# Patient Record
Sex: Male | Born: 1970
Health system: Southern US, Community
[De-identification: ages and names within clinical notes are randomized; demographics above are authoritative.]

## PROBLEM LIST (undated history)

## (undated) DIAGNOSIS — E785 Hyperlipidemia, unspecified: Secondary | ICD-10-CM

## (undated) DIAGNOSIS — N200 Calculus of kidney: Secondary | ICD-10-CM

## (undated) DIAGNOSIS — Z6841 Body Mass Index (BMI) 40.0 and over, adult: Secondary | ICD-10-CM

## (undated) DIAGNOSIS — J301 Allergic rhinitis due to pollen: Secondary | ICD-10-CM

## (undated) DIAGNOSIS — K219 Gastro-esophageal reflux disease without esophagitis: Secondary | ICD-10-CM

## (undated) DIAGNOSIS — E039 Hypothyroidism, unspecified: Secondary | ICD-10-CM

## (undated) HISTORY — DX: Allergic rhinitis due to pollen: J30.1

## (undated) HISTORY — DX: Gastro-esophageal reflux disease without esophagitis: K21.9

## (undated) HISTORY — DX: Calculus of kidney: N20.0

## (undated) HISTORY — DX: Hyperlipidemia, unspecified: E78.5

## (undated) HISTORY — PX: GANGLION CYST EXCISION: SHX1691

## (undated) HISTORY — DX: Body Mass Index (BMI) 40.0 and over, adult: Z684

## (undated) HISTORY — DX: Hypothyroidism, unspecified: E03.9

## (undated) HISTORY — DX: Morbid (severe) obesity due to excess calories: E66.01

## (undated) HISTORY — PX: OTHER SURGICAL HISTORY: SHX169

---

## 1997-06-25 ENCOUNTER — Ambulatory Visit (HOSPITAL_BASED_OUTPATIENT_CLINIC_OR_DEPARTMENT_OTHER): Admission: RE | Admit: 1997-06-25 | Discharge: 1997-06-25 | Payer: Self-pay | Admitting: *Deleted

## 1997-12-29 ENCOUNTER — Other Ambulatory Visit: Admission: RE | Admit: 1997-12-29 | Discharge: 1997-12-29 | Payer: Self-pay | Admitting: Obstetrics & Gynecology

## 2003-07-28 ENCOUNTER — Encounter (INDEPENDENT_AMBULATORY_CARE_PROVIDER_SITE_OTHER): Payer: Self-pay | Admitting: Internal Medicine

## 2004-01-07 ENCOUNTER — Ambulatory Visit: Payer: Self-pay | Admitting: Family Medicine

## 2005-06-14 ENCOUNTER — Ambulatory Visit: Payer: Self-pay | Admitting: Family Medicine

## 2005-11-08 ENCOUNTER — Emergency Department: Payer: Self-pay

## 2005-11-12 ENCOUNTER — Ambulatory Visit: Payer: Self-pay | Admitting: Family Medicine

## 2005-11-15 ENCOUNTER — Ambulatory Visit: Payer: Self-pay | Admitting: Urology

## 2005-11-27 ENCOUNTER — Ambulatory Visit: Payer: Self-pay | Admitting: Family Medicine

## 2006-02-18 ENCOUNTER — Emergency Department: Payer: Self-pay | Admitting: Emergency Medicine

## 2006-03-26 ENCOUNTER — Ambulatory Visit: Payer: Self-pay | Admitting: Family Medicine

## 2006-03-26 LAB — CONVERTED CEMR LAB
ALT: 49 units/L — ABNORMAL HIGH (ref 0–40)
AST: 29 units/L (ref 0–37)
Albumin: 4 g/dL (ref 3.5–5.2)
Alkaline Phosphatase: 87 units/L (ref 39–117)
BUN: 11 mg/dL (ref 6–23)
CO2: 29 meq/L (ref 19–32)
Calcium: 9.9 mg/dL (ref 8.4–10.5)
Chloride: 106 meq/L (ref 96–112)
Cholesterol: 219 mg/dL (ref 0–200)
Creatinine, Ser: 1.2 mg/dL (ref 0.4–1.5)
Direct LDL: 143.1 mg/dL
Free T4: 0.9 ng/dL (ref 0.6–1.6)
GFR calc Af Amer: 88 mL/min
GFR calc non Af Amer: 73 mL/min
Glucose, Bld: 103 mg/dL — ABNORMAL HIGH (ref 70–99)
HDL: 35.8 mg/dL — ABNORMAL LOW (ref >39.0)
Potassium: 4.6 meq/L (ref 3.5–5.1)
Sodium: 142 meq/L (ref 135–145)
TSH: 2.09 microintl units/mL (ref 0.35–5.50)
Total Bilirubin: 1.1 mg/dL (ref 0.3–1.2)
Total CHOL/HDL Ratio: 6.1
Total Protein: 7.4 g/dL (ref 6.0–8.3)
Triglycerides: 202 mg/dL (ref 0–149)
VLDL: 40 mg/dL (ref 0–40)

## 2006-03-27 ENCOUNTER — Ambulatory Visit: Payer: Self-pay | Admitting: Family Medicine

## 2006-05-06 ENCOUNTER — Ambulatory Visit: Payer: Self-pay | Admitting: Family Medicine

## 2006-09-23 ENCOUNTER — Ambulatory Visit: Payer: Self-pay | Admitting: Family Medicine

## 2006-09-24 LAB — CONVERTED CEMR LAB: TSH: 1.65 microintl units/mL (ref 0.35–5.50)

## 2006-10-23 ENCOUNTER — Emergency Department: Payer: Self-pay | Admitting: Emergency Medicine

## 2006-10-27 ENCOUNTER — Observation Stay: Payer: Self-pay | Admitting: Urology

## 2006-12-04 ENCOUNTER — Telehealth (INDEPENDENT_AMBULATORY_CARE_PROVIDER_SITE_OTHER): Payer: Self-pay | Admitting: *Deleted

## 2007-04-09 ENCOUNTER — Ambulatory Visit: Payer: Self-pay | Admitting: Urology

## 2007-06-19 ENCOUNTER — Ambulatory Visit: Payer: Self-pay | Admitting: Family Medicine

## 2007-06-19 DIAGNOSIS — R5381 Other malaise: Secondary | ICD-10-CM | POA: Insufficient documentation

## 2007-06-19 DIAGNOSIS — R5383 Other fatigue: Secondary | ICD-10-CM

## 2007-06-20 LAB — CONVERTED CEMR LAB
BUN: 11 mg/dL (ref 6–23)
Basophils Absolute: 0 10*3/uL (ref 0.0–0.1)
Basophils Relative: 0.4 % (ref 0.0–1.0)
CO2: 30 meq/L (ref 19–32)
Calcium: 9.5 mg/dL (ref 8.4–10.5)
Chloride: 107 meq/L (ref 96–112)
Creatinine, Ser: 1 mg/dL (ref 0.4–1.5)
Eosinophils Absolute: 0.1 10*3/uL (ref 0.0–0.7)
Eosinophils Relative: 0.7 % (ref 0.0–5.0)
GFR calc Af Amer: 108 mL/min
GFR calc non Af Amer: 89 mL/min
Glucose, Bld: 90 mg/dL (ref 70–99)
HCT: 48.6 % (ref 39.0–52.0)
Hemoglobin: 16.5 g/dL (ref 13.0–17.0)
Lymphocytes Relative: 29.4 % (ref 12.0–46.0)
MCHC: 33.9 g/dL (ref 30.0–36.0)
MCV: 88.4 fL (ref 78.0–100.0)
Monocytes Absolute: 0.5 10*3/uL (ref 0.1–1.0)
Monocytes Relative: 6.7 % (ref 3.0–12.0)
Neutro Abs: 4.5 10*3/uL (ref 1.4–7.7)
Neutrophils Relative %: 62.8 % (ref 43.0–77.0)
Platelets: 197 10*3/uL (ref 150–400)
Potassium: 4.5 meq/L (ref 3.5–5.1)
RBC: 5.49 M/uL (ref 4.22–5.81)
RDW: 13.1 % (ref 11.5–14.6)
Sodium: 142 meq/L (ref 135–145)
TSH: 1.91 microintl units/mL (ref 0.35–5.50)
WBC: 7.2 10*3/uL (ref 4.5–10.5)

## 2007-07-10 DIAGNOSIS — N200 Calculus of kidney: Secondary | ICD-10-CM | POA: Insufficient documentation

## 2007-07-10 DIAGNOSIS — E7800 Pure hypercholesterolemia, unspecified: Secondary | ICD-10-CM | POA: Insufficient documentation

## 2007-07-10 DIAGNOSIS — J309 Allergic rhinitis, unspecified: Secondary | ICD-10-CM | POA: Insufficient documentation

## 2007-07-10 DIAGNOSIS — E039 Hypothyroidism, unspecified: Secondary | ICD-10-CM | POA: Insufficient documentation

## 2007-09-29 ENCOUNTER — Ambulatory Visit: Payer: Self-pay | Admitting: Family Medicine

## 2007-09-29 DIAGNOSIS — M545 Low back pain, unspecified: Secondary | ICD-10-CM | POA: Insufficient documentation

## 2007-10-09 ENCOUNTER — Encounter (INDEPENDENT_AMBULATORY_CARE_PROVIDER_SITE_OTHER): Payer: Self-pay | Admitting: Internal Medicine

## 2007-10-13 ENCOUNTER — Encounter (INDEPENDENT_AMBULATORY_CARE_PROVIDER_SITE_OTHER): Payer: Self-pay | Admitting: Internal Medicine

## 2008-02-18 ENCOUNTER — Ambulatory Visit: Payer: Self-pay | Admitting: Family Medicine

## 2008-02-19 ENCOUNTER — Telehealth (INDEPENDENT_AMBULATORY_CARE_PROVIDER_SITE_OTHER): Payer: Self-pay | Admitting: Internal Medicine

## 2008-02-19 LAB — CONVERTED CEMR LAB: TSH: 1.38 microintl units/mL (ref 0.35–5.50)

## 2008-08-12 ENCOUNTER — Telehealth: Payer: Self-pay | Admitting: Family Medicine

## 2008-11-29 ENCOUNTER — Telehealth (INDEPENDENT_AMBULATORY_CARE_PROVIDER_SITE_OTHER): Payer: Self-pay | Admitting: *Deleted

## 2008-11-30 ENCOUNTER — Ambulatory Visit: Payer: Self-pay | Admitting: Family Medicine

## 2008-12-01 ENCOUNTER — Telehealth (INDEPENDENT_AMBULATORY_CARE_PROVIDER_SITE_OTHER): Payer: Self-pay | Admitting: Internal Medicine

## 2008-12-01 LAB — CONVERTED CEMR LAB
BUN: 11 mg/dL (ref 6–23)
CO2: 29 meq/L (ref 19–32)
Calcium: 9.3 mg/dL (ref 8.4–10.5)
Chloride: 108 meq/L (ref 96–112)
Creatinine, Ser: 1 mg/dL (ref 0.4–1.5)
GFR calc non Af Amer: 88.14 mL/min (ref >60)
Glucose, Bld: 102 mg/dL — ABNORMAL HIGH (ref 70–99)
Potassium: 4.2 meq/L (ref 3.5–5.1)
Sodium: 142 meq/L (ref 135–145)
TSH: 1.85 microintl units/mL (ref 0.35–5.50)

## 2009-03-27 ENCOUNTER — Emergency Department: Payer: Self-pay | Admitting: Internal Medicine

## 2009-04-07 ENCOUNTER — Ambulatory Visit: Payer: Self-pay | Admitting: Urology

## 2009-05-11 ENCOUNTER — Ambulatory Visit: Payer: Self-pay | Admitting: Family Medicine

## 2009-05-12 LAB — CONVERTED CEMR LAB
ALT: 55 units/L — ABNORMAL HIGH (ref 0–53)
AST: 31 units/L (ref 0–37)
Albumin: 4.1 g/dL (ref 3.5–5.2)
Alkaline Phosphatase: 81 units/L (ref 39–117)
BUN: 11 mg/dL (ref 6–23)
Basophils Absolute: 0 10*3/uL (ref 0.0–0.1)
Basophils Relative: 0 % (ref 0.0–3.0)
Bilirubin, Direct: 0.1 mg/dL (ref 0.0–0.3)
CO2: 30 meq/L (ref 19–32)
Calcium: 9.5 mg/dL (ref 8.4–10.5)
Chloride: 110 meq/L (ref 96–112)
Cholesterol: 188 mg/dL (ref 0–200)
Creatinine, Ser: 1 mg/dL (ref 0.4–1.5)
Eosinophils Absolute: 0 10*3/uL (ref 0.0–0.7)
Eosinophils Relative: 0.7 % (ref 0.0–5.0)
Free T4: 0.9 ng/dL (ref 0.6–1.6)
GFR calc non Af Amer: 87.94 mL/min (ref >60)
Glucose, Bld: 93 mg/dL (ref 70–99)
HCT: 48.6 % (ref 39.0–52.0)
HDL: 38.4 mg/dL — ABNORMAL LOW (ref >39.00)
Hemoglobin: 16 g/dL (ref 13.0–17.0)
LDL Cholesterol: 121 mg/dL — ABNORMAL HIGH (ref 0–99)
Lymphocytes Relative: 30.9 % (ref 12.0–46.0)
Lymphs Abs: 2 10*3/uL (ref 0.7–4.0)
MCHC: 32.9 g/dL (ref 30.0–36.0)
MCV: 90.7 fL (ref 78.0–100.0)
Monocytes Absolute: 0.4 10*3/uL (ref 0.1–1.0)
Monocytes Relative: 5.6 % (ref 3.0–12.0)
Neutro Abs: 4.1 10*3/uL (ref 1.4–7.7)
Neutrophils Relative %: 62.8 % (ref 43.0–77.0)
PSA: 0.66 ng/mL (ref 0.10–4.00)
Platelets: 190 10*3/uL (ref 150.0–400.0)
Potassium: 4.5 meq/L (ref 3.5–5.1)
RBC: 5.35 M/uL (ref 4.22–5.81)
RDW: 13.1 % (ref 11.5–14.6)
Sodium: 144 meq/L (ref 135–145)
T3, Free: 3.3 pg/mL (ref 2.3–4.2)
T4, Total: 9.8 ug/dL (ref 5.0–12.5)
TSH: 1.59 microintl units/mL (ref 0.35–5.50)
Total Bilirubin: 0.8 mg/dL (ref 0.3–1.2)
Total CHOL/HDL Ratio: 5
Total Protein: 7.5 g/dL (ref 6.0–8.3)
Triglycerides: 143 mg/dL (ref 0.0–149.0)
VLDL: 28.6 mg/dL (ref 0.0–40.0)
WBC: 6.5 10*3/uL (ref 4.5–10.5)

## 2010-04-06 NOTE — Consult Note (Signed)
Summary: [facility]/Consultation Report/Dr. [facility]/Consultation Report/Dr. Ethelene Hal   Imported By: Mickle Asper 10/15/2007 10:15:47  _____________________________________________________________________  External Attachment:    Type:   Image     Comment:   External Document

## 2010-04-06 NOTE — Progress Notes (Signed)
Summary: update on wt loss  Phone Note Outgoing Call   Call placed by: Lewanda Rife Call placed to: Patient Summary of Call: Called pt about lab results. Pt stated he has been talking with a dietician, exercising twice a week and writing down everything he eats. Pt. scheduled an appt to see you on 02/18/09 at 8:15am. This is FYI. Initial call taken by: Lewanda Rife LPN,  December 01, 2008 4:36 PM  Follow-up for Phone Call        noted  Billie-Lynn Tyler Deis FNP  December 01, 2008 4:39 PM

## 2010-04-06 NOTE — Assessment & Plan Note (Signed)
Summary: BACK PAIN /RBH   Vital Signs:  Patient Profile:   40 Years Old Male Weight:      299 pounds Temp:     99.0 degrees F oral Pulse rate:   96 / minute BP sitting:   121 / 91  (right arm) Cuff size:   large  Vitals Entered By: Cooper Render (September 29, 2007 4:11 PM)                 Chief Complaint:  back pain w/ radiation to L) leg and hurts to sit.  History of Present Illness: Here for back pain with radiation to L leg--has been going on for yrs, getting worse.  Hurt 5 yrs ago at work.  Had pain down L leg initially, improved but always there. --has been seeing chiropractor 3x wk fro 2+ wks, then returns. --now impacting on life more --cant sit for long--has gotten jury duty--10/08/07--[place], [place].  Passed andother renal stone last wk, drinking lots of water.    Current Allergies (reviewed today): ! DARVOCET ! * ZYRTEC ! PENICILLIN  Past Medical History:    Reviewed history from 07/09/2007 and no changes required:       Allergic rhinitis       Hypothyroidism       Nephrolithiasis, hx of  Past Surgical History:    Reviewed history from 07/09/2007 and no changes required:       R GANGLION CYSTECTOMY       ESL- secondary to kidney stone (DR [facility]) (11/2005)     Review of Systems      See HPI   Physical Exam  General:     alert, well-developed, well-nourished, and well-hydrated.  morbidly obese Msk:     tender T11-L 2 and L paraspinous to buttocks and down L leg to knee --no pain in R paraspinous. --pain with i nternal and external rotation of L leg, minimal with R  --Pain with st leg raise L 30degrees, and lateral, pain about the same on R Neurologic:     alert & oriented X3, sensation intact to light touch, and gait normal.   Skin:     turgor normal, color normal, and no rashes.   Psych:     normally interactive.      Impression & Recommendations:  Problem # 1:  BACK PAIN, LUMBAR (ICD-724.2) Assessment: Deteriorated having more  low back pain, has been going on since injury at work  ~9yrs ago will refer to Ortho for eval and tx will get old MRI report will do letter for jury deferment Orders: Orthopedic Referral (Ortho)   Problem # 2:  OBESITY (ICD-278.00) Assessment: Unchanged strongly encouraged reduction of calories, not able to do much exercise at this time due to pain in back  Complete Medication List: 1)  Synthroid 75 Mcg Tabs (Levothyroxine sodium) .... Take 1 tablet by mouth once a day   Patient Instructions: 1)  get copy of MRI of back done 5 yrs ago at [facility]. 2)  refer to Ortho   ] Prior Medications (reviewed today): SYNTHROID 75 MCG TABS (LEVOTHYROXINE SODIUM) Take 1 tablet by mouth once a day Current Allergies (reviewed today): ! DARVOCET ! * ZYRTEC ! PENICILLIN

## 2010-04-06 NOTE — Miscellaneous (Signed)
Summary: MRI 5/05--DDD lumbar  Clinical Lists Changes  Observations: Added new observation of PAST SURG HX: R GANGLION CYSTECTOMY ESL- secondary to kidney stone (DR [facility]) (11/2005) MRI lumbar spine--07/28/03--mild DDD L4-5. L5-S1  (10/09/2007 17:35)       Past Surgical History:    R GANGLION CYSTECTOMY    ESL- secondary to kidney stone (DR [facility]) (11/2005)    MRI lumbar spine--07/28/03--mild DDD L4-5. L5-S1

## 2010-04-06 NOTE — Progress Notes (Signed)
Summary: Rx Synthroid  Phone Note Outgoing Call   Call placed by: Sydell Axon, LPN Call placed to: Patient Summary of Call: Called pt regarding his lab results.  Pt requested that his Synthroid be sent to [facility]. Initial call taken by: Sydell Axon,  February 19, 2008 9:38 AM  Follow-up for Phone Call        Rx sent to [facility] Bean FNP  February 19, 2008 12:12 PM       Prescriptions: SYNTHROID 75 MCG TABS (LEVOTHYROXINE SODIUM) Take 1 tablet by mouth once a day  #30 x 6   Entered and Authorized by:   Gildardo Griffes FNP   Signed by:   Gildardo Griffes FNP on 02/19/2008   Method used:   Electronically to        [facility]* (retail)       [address]       [place], [place]  [address]       Ph: [phone]       Fax: [phone]   RxID:   [id number]

## 2010-04-06 NOTE — Assessment & Plan Note (Signed)
Summary: 30 MIN F/U/BILLIE'S PT/REFILL MED/CLE   Vital Signs:  Patient profile:   40 year old male Height:      73 inches Weight:      308.2 pounds BMI:     40.81 Temp:     97.7 degrees F oral Pulse rate:   80 / minute Pulse rhythm:   regular BP sitting:   120 / 84  (left arm) Cuff size:   large  Vitals Entered By: Benny Lennert CMA Duncan Dull) (May 11, 2009 9:05 AM)  History of Present Illness: Chief complaint 30 minute follow up billie patient   Thyroid: needs check  Morbid obesity  Was on [place] thyroid  hypothyroid In his early twenties - ws on some thyroid medication for years. Was always tired. Has been on the same dosage for a long time.   Has been putting on a lot of weight. Feels like he has no energy and like when thyroid may have been too low.  In nov was up to 324.  Starting in Nov, joined [employer] - with walking right knee feels like it will --- used to bodybuild and powerlift.   Fatigue, diffusely.  Morbid obesity  Needs multiple screening tests  Allergies: 1)  ! Darvocet 2)  ! * Zyrtec 3)  ! Penicillin  Past History:  Past medical, surgical, family and social histories (including risk factors) reviewed, and no changes noted (except as noted below).  Past Medical History: Allergic rhinitis Hypothyroidism Nephrolithiasis, hx of Hyperlipidemia  Past Surgical History: Reviewed history from 10/09/2007 and no changes required. R GANGLION CYSTECTOMY ESL- secondary to kidney stone ([facility]) (11/2005) MRI lumbar spine--07/28/03--mild DDD L4-5. L5-S1  Family History: Reviewed history from 02/18/2008 and no changes required. Father A--bad heart disease, MI post cardiac arrest, elevated lipids Mother A  thyroid disease Sister A  Social History: Reviewed history from 02/18/2008 and no changes required. 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 CV:  Denies chest pain or discomfort and shortness of breath with exertion. Resp:  Denies cough, shortness of breath, and wheezing.  Physical Exam  General:  Well-developed,well-nourished,in no acute distress; alert,appropriate and cooperative throughout examination Head:  Normocephalic and atraumatic without obvious abnormalities. No apparent alopecia or balding. Ears:  no external deformities.   Nose:  no external deformity.   Chest Wall:  No deformities, masses, tenderness or gynecomastia noted. Lungs:  Normal respiratory effort, chest expands symmetrically. Lungs are clear to auscultation, no crackles or wheezes. Heart:  Normal rate and regular rhythm. S1 and S2 normal without gallop, murmur, click, rub or other extra sounds. Extremities:  No clubbing, cyanosis, edema, or deformity noted with normal full range of motion of all joints.   Neurologic:  alert & oriented X3, sensation intact to light touch, and gait normal.   Cervical Nodes:  No lymphadenopathy noted Psych:  Cognition and judgment appear intact. Alert and cooperative with normal attention span and concentration. No apparent delusions, illusions, hallucinations   Impression & Recommendations:  Problem # 1:  HYPOTHYROIDISM (ICD-244.9) Assessment Deteriorated  His updated medication list for this problem includes:    Synthroid 75 Mcg Tabs (Levothyroxine sodium) .Marland Kitchen... Take 1 tablet by mouth once a day  Orders: Venipuncture (27253) TLB-TSH (Thyroid Stimulating Hormone) (84443-TSH) TLB-T3, Free (Triiodothyronine) (84481-T3FREE) TLB-T4 (Thyrox), Total (84436-T4) TLB-T4 (Thyrox), Free [phone])  Problem # 2:  FATIGUE (ICD-780.79) chronic fatigue  Orders: Venipuncture (14782) TLB-BMP (Basic Metabolic Panel-BMET) (80048-METABOL) TLB-CBC Platelet - w/Differential (85025-CBCD) TLB-Hepatic/Liver Function Pnl (80076-HEPATIC)  Problem # 3:  SPECIAL SCREENING MALIGNANT  NEOPLASM OF PROSTATE (ICD-V76.44)  Orders: TLB-PSA (Prostate Specific Antigen) (84153-PSA)  Problem # 4:  HYPERCHOLESTEROLEMIA (ICD-272.0)  Orders: Venipuncture (95621) TLB-Lipid Panel (80061-LIPID)  Complete Medication List: 1)  Synthroid 75 Mcg Tabs (Levothyroxine sodium) .... Take 1 tablet by mouth once a day  Patient Instructions: 1)  CPX at your convenience  Current Allergies (reviewed today): ! DARVOCET ! * ZYRTEC ! PENICILLIN

## 2010-04-06 NOTE — Progress Notes (Signed)
**Note De-identified Lathaniel Legate Obfuscation**  **Note De-Identified Kristy Schomburg Obfuscation** Phone Note Call from Patient   Caller: Patient Summary of Call: Bent over, hurt his lower back and has LB DDD. Wanted to see if I would call in muscle relaxant while on call. Typically, I don't call in muscle relaxants, never narcotics or anything controlled while on call. I asked him to call [facility] [place] in AM at 8:30 Initial call taken by: Hannah Beat MD,  August 12, 2008 5:04 PM

## 2010-04-06 NOTE — Progress Notes (Signed)
Summary: refill med  Phone Note Call from Patient Call back at [facility] Phone [phone] Call back at cell [phone], wifes cell [phone]   Caller: Spouse- dawn Call For: bean Summary of Call: pt is requesting refill of jock itch med called to [facility] pharmacy Initial call taken by: Liane Comber,  December 04, 2006 11:53 AM  Follow-up for Phone Call        rx x2 attatched  ..................................................................Marland KitchenBillie-Lynn Tyler Deis FNP  December 04, 2006 1:49 PM   Additional Follow-up for Phone Call Additional follow up Details #1::        RX DONE, SENT ELECTRONICALLY wife advised ..................................................................Marland KitchenNatasha Chavers  December 04, 2006 1:54 PM       Prescriptions: DIFLUCAN 150 MG  TABS (FLUCONAZOLE) 1 qd  #5 x 0   Entered and Authorized by:   Gildardo Griffes FNP   Signed by:   Gildardo Griffes FNP on 12/04/2006   Method used:   Print then Give to Patient   RxID:   [id number] NYSTATIN   POWD (NYSTATIN) apply thinnly 3-4xqd to rash area  #1 x 3   Entered and Authorized by:   Gildardo Griffes FNP   Signed by:   Gildardo Griffes FNP on 12/04/2006   Method used:   Print then Give to Patient   RxID:   [id number]     Appended Document: refill med    Prescriptions: NYSTATIN   POWD (NYSTATIN) apply thinnly 3-4xqd to rash area  #1 x 3   Entered by:   Liane Comber   Authorized by:   Gildardo Griffes FNP   Signed by:   Liane Comber on 12/05/2006   Method used:   Electronically sent to ...       [facility]*       [address]       [place], [place]  [address]       Ph: [phone] or [phone]       Fax: [phone]   RxID:   [id number] DIFLUCAN 150 MG  TABS (FLUCONAZOLE) 1 qd  #5 x 0   Entered by:   Liane Comber   Authorized by:   Gildardo Griffes FNP   Signed by:   Liane Comber on  12/05/2006   Method used:   Electronically sent to ...       [facility]*       [address]       [place], [place]  [address]       Ph: [phone] or [phone]       Fax: [phone]   RxID:   [id number]

## 2010-04-06 NOTE — Progress Notes (Signed)
Summary: pt needs labs for thyroid  Phone Note Call from Patient Call back at [phone] Message from:  Patient  Caller: Patient Call For: Gildardo Griffes FNP Summary of Call: Pt is asking for thyroid labs, please order.Patient is coming in Tues for lab draw. Initial call taken by: Lowella Petties CMA,  November 29, 2008 10:29 AM  Follow-up for Phone Call        will order TSH--244.9 and bmet--v70.0  Billie-Lynn Tyler Deis FNP  November 29, 2008 5:57 PM   Additional Follow-up for Phone Call Additional follow up Details #1::        done Additional Follow-up by: Mills Koller,  November 30, 2008 8:41 AM

## 2010-04-06 NOTE — Assessment & Plan Note (Signed)
Summary: MED REFILL/RBH   Vital Signs:  Patient Profile:   40 Years Old Male Weight:      298 pounds Temp:     99.0 degrees F oral Pulse rate:   89 / minute BP sitting:   126 / 90  (left arm) Cuff size:   large  Vitals Entered By: Cooper Render (June 19, 2007 9:51 AM)                 Chief Complaint:  MED REFILL.  History of Present Illness: Here for refill of Synthroid Tired--past 1 d, temp up to 99 today. walking  treadmill 3xwk --makes hungry has had 3 renal stones sinc 2001--has small stone in R kidney now.  most recent 9/08.    Current Allergies (reviewed today): ! DARVOCET     Review of Systems      See HPI   Physical Exam  General:     wt gain of 5 lbs since 8/08 alert, well-developed, well-nourished, well-hydrated, and overweight-appearing.   Neck:     normal carotid upstroke, no carotid bruits, and thyromegaly.   Lungs:     normal respiratory effort, no intercostal retractions, and no accessory muscle use.   Heart:     normal rate, regular rhythm, and no murmur.   Neurologic:     alert & oriented X3 and gait normal.   Skin:     turgor normal, color normal, and no rashes.   Psych:     normally interactive, good eye contact, flat affect, and subdued.      Impression & Recommendations:  Problem # 1:  HYPOTHYROIDISM NOS (ICD-244.9) Assessment: Unchanged will get TSH and titrate dosage if needed His updated medication list for this problem includes:    Synthroid 75 Mcg Tabs (Levothyroxine sodium) .Marland Kitchen... Take 1 tablet by mouth once a day  Orders: Venipuncture (19147) TLB-TSH (Thyroid Stimulating Hormone) (84443-TSH)   Problem # 2:  FATIGUE (ICD-780.79) Assessment: New will get CBC and bmet, looking for cause of fatigue--notify and tx if needed Orders: TLB-BMP (Basic Metabolic Panel-BMET) (80048-METABOL) TLB-CBC Platelet - w/Differential (85025-CBCD)   Problem # 3:  OBESITY (ICD-278.00) Assessment: Deteriorated strongly encouraged to  loose wt  Complete Medication List: 1)  Synthroid 75 Mcg Tabs (Levothyroxine sodium) .... Take 1 tablet by mouth once a day     Prescriptions: SYNTHROID 75 MCG TABS (LEVOTHYROXINE SODIUM) Take 1 tablet by mouth once a day  #30 x 5   Entered and Authorized by:   Gildardo Griffes FNP   Signed by:   Gildardo Griffes FNP on 06/19/2007   Method used:   Electronically sent to ...       [facility]*       [address]       [place], [place]  [address]       Ph: [phone] or [phone]       Fax: [phone]   RxID:   [id number]  ] Prior Medications (reviewed today): SYNTHROID 75 MCG TABS (LEVOTHYROXINE SODIUM) Take 1 tablet by mouth once a day Current Allergies (reviewed today): ! DARVOCET

## 2010-04-06 NOTE — Assessment & Plan Note (Signed)
Summary: REFILL THYROID MEDICATION/CLE   Vital Signs:  Patient Profile:   40 Years Old Male Weight:      303 pounds Temp:     99.0 degrees F oral Pulse rate:   80 / minute Pulse rhythm:   regular BP sitting:   128 / 90  (left arm) Cuff size:   large  Vitals Entered By: Sydell Axon (February 18, 2008 8:57 AM)                 Chief Complaint:  Refill thyroid medication and headaches.  History of Present Illness: Here due to headaches--increased stress--father with new defib--feeling better, was in cardiac arrest at home.    needs refill of thyroid med    Current Allergies (reviewed today): ! DARVOCET ! * ZYRTEC ! 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  Resp      Denies cough, shortness of breath, and wheezing.  MS      Complains of joint pain.      has seen Ortho re elbow/lower arm pain--02/2005--cant straighten arm completely back pain--saw Ortho--needs to wait for insurance 03/2008  Neuro      Complains of headaches.  Psych      Complains of anxiety.      Denies depression.   Physical Exam  General:     alert, well-developed, well-nourished, well-hydrated, and overweight-appearing.   Neck:     no thyromegaly.   Lungs:     normal respiratory effort, no intercostal retractions, no accessory muscle use, and normal breath sounds.   Heart:     normal rate, regular rhythm, and no murmur.   Neurologic:     alert & oriented X3, cranial nerves II-XII intact, strength normal in all extremities, sensation intact to light touch, and gait normal.   Psych:     normally interactive, flat affect, and subdued.       Impression & Recommendations:  Problem # 1:  HYPOTHYROIDISM (ICD-244.9) Assessment: Comment Only needs TSH to monitor status--will get today and titrate if needed His updated medication list for this problem includes:    Synthroid 75 Mcg Tabs (Levothyroxine sodium) .Marland Kitchen... Take 1 tablet by mouth once a day  Orders: Venipuncture (04540)   Problem # 2:  HEADACHE (ICD-784.0) Assessment: New suspect is tension HA to use IBP 600-800 q 6-8h respectfully--see back if dies not resolve encouraged some exercise daily tohelp reduce stress  Problem # 3:  OBESITY (ICD-278.00) Assessment: Deteriorated wt gain ion past 23mo--now over 300lbs strongly encouraged W[employer] as has not been abl;e to get wt off withmethods he is using  Complete Medication List: 1)  Synthroid 75 Mcg Tabs (Levothyroxine sodium) .... Take 1 tablet by mouth once a day  Other Orders: TLB-TSH (Thyroid Stimulating Hormone) (98119-JYN)   Patient Instructions: 1)  Please schedule a follow-up appointment in 6 months.   ] Current Allergies (reviewed today): ! DARVOCET ! * ZYRTEC ! PENICILLIN

## 2010-04-06 NOTE — Letter (Signed)
Summary: [facility]/MRI of the Lumbar Spine W/O Contrast/Dr. Vear Clock   [facility]/MRI of the Lumbar Spine W/O Contrast/Dr. Vear Clock   Imported By: Mickle Asper 10/02/2007 15:45:46  _____________________________________________________________________  External Attachment:    Type:   Image     Comment:   External Document

## 2010-04-06 NOTE — Assessment & Plan Note (Signed)
Summary: RASH  Medications Added SYNTHROID 75 MCG TABS (LEVOTHYROXINE SODIUM) Take 1 tablet by mouth once a day DIFLUCAN 150 MG  TABS (FLUCONAZOLE) 1 qd NYSTATIN   POWD (NYSTATIN) apply thinnly 3-4xqd to rash area      Allergies Added: ! DARVOCET  Vital Signs:  Patient Profile:   40 Years Old Male Weight:      293 pounds Temp:     99.4 degrees F oral Pulse rate:   90 / minute BP sitting:   134 / 89  (left arm)  Vitals Entered By: Cooper Render (September 23, 2006 9:09 AM)               Chief Complaint:  rash.  History of Present Illness: Here due to rash in groin, again.  Has been treated wit Diflucan previously which helps.  Using hairdryer and changes uniforn and undies at lunch.  Works in a non-ACed garage.  Is aware of wt gain to 314lbs 6 wks ago.  Now eating q2-3h---has lost down to 293 as of today.  Was 286 in 1/08.    Current Allergies: ! DARVOCET     Review of Systems      See HPI   Physical Exam  General:     alert, well-developed, well-nourished, and well-hydrated.   Head:     normocephalic.   Eyes:     no injection.   Lungs:     normal respiratory effort.   Neurologic:     alert & oriented X3, sensation intact to light touch, and gait normal.   Skin:     eryth/edema rashin R groin, spot on abd.,  no scaling, moist. Psych:     normally interactive.      Impression & Recommendations:  Problem # 1:  FUNGAL DERMATITIS (ICD-111.9)  His updated medication list for this problem includes:    Diflucan 150 Mg Tabs (Fluconazole) .Marland Kitchen... 1 qd    Nystatin Powd (Nystatin) .Marland Kitchen... Apply thinnly 3-4xqd to rash area can use corn startch in lgroin also --thinnly  Problem # 2:  OBESITY (ICD-278.00) strongly encouraged wt loss  Medications Added to Medication List This Visit: 1)  Synthroid 75 Mcg Tabs (Levothyroxine sodium) .... Take 1 tablet by mouth once a day 2)  Diflucan 150 Mg Tabs (Fluconazole) .Marland Kitchen.. 1 qd 3)  Nystatin Powd (Nystatin) .... Apply thinnly  3-4xqd to rash area   Patient Instructions: 1)  reviewed plan with patient--agrees    Prescriptions: NYSTATIN   POWD (NYSTATIN) apply thinnly 3-4xqd to rash area  #1 x 1   Entered and Authorized by:   Gildardo Griffes FNP   Signed by:   Gildardo Griffes FNP on 09/23/2006   Method used:   Print then Give to Patient   RxID:   [id number] DIFLUCAN 150 MG  TABS (FLUCONAZOLE) 1 qd  #5 x 0   Entered and Authorized by:   Gildardo Griffes FNP   Signed by:   Gildardo Griffes FNP on 09/23/2006   Method used:   Print then Give to Patient   RxID:   [id number]

## 2010-05-18 ENCOUNTER — Encounter: Payer: Self-pay | Admitting: Family Medicine

## 2010-05-18 ENCOUNTER — Other Ambulatory Visit: Payer: Self-pay | Admitting: Family Medicine

## 2010-05-18 ENCOUNTER — Ambulatory Visit (INDEPENDENT_AMBULATORY_CARE_PROVIDER_SITE_OTHER): Payer: BC Managed Care – PPO | Admitting: Family Medicine

## 2010-05-18 DIAGNOSIS — R5381 Other malaise: Secondary | ICD-10-CM

## 2010-05-18 DIAGNOSIS — E039 Hypothyroidism, unspecified: Secondary | ICD-10-CM

## 2010-05-18 DIAGNOSIS — Z125 Encounter for screening for malignant neoplasm of prostate: Secondary | ICD-10-CM

## 2010-05-18 DIAGNOSIS — E7800 Pure hypercholesterolemia, unspecified: Secondary | ICD-10-CM

## 2010-05-18 DIAGNOSIS — R5383 Other fatigue: Secondary | ICD-10-CM

## 2010-05-19 LAB — CBC WITH DIFFERENTIAL/PLATELET
Basophils Absolute: 0 10*3/uL (ref 0.0–0.1)
Basophils Relative: 0.4 % (ref 0.0–3.0)
Eosinophils Absolute: 0 10*3/uL (ref 0.0–0.7)
Eosinophils Relative: 0.5 % (ref 0.0–5.0)
HCT: 44.8 % (ref 39.0–52.0)
Hemoglobin: 15.7 g/dL (ref 13.0–17.0)
Lymphocytes Relative: 28.5 % (ref 12.0–46.0)
Lymphs Abs: 2.7 10*3/uL (ref 0.7–4.0)
MCHC: 35 g/dL (ref 30.0–36.0)
MCV: 89.5 fl (ref 78.0–100.0)
Monocytes Absolute: 0.5 10*3/uL (ref 0.1–1.0)
Monocytes Relative: 5.6 % (ref 3.0–12.0)
Neutro Abs: 6.2 10*3/uL (ref 1.4–7.7)
Neutrophils Relative %: 65 % (ref 43.0–77.0)
Platelets: 208 10*3/uL (ref 150.0–400.0)
RBC: 5 Mil/uL (ref 4.22–5.81)
RDW: 14.1 % (ref 11.5–14.6)
WBC: 9.6 10*3/uL (ref 4.5–10.5)

## 2010-05-19 LAB — BASIC METABOLIC PANEL
BUN: 13 mg/dL (ref 6–23)
CO2: 27 mEq/L (ref 19–32)
Calcium: 9.5 mg/dL (ref 8.4–10.5)
Chloride: 106 mEq/L (ref 96–112)
Creatinine, Ser: 1.2 mg/dL (ref 0.4–1.5)
GFR: 73 mL/min (ref >60.00)
Glucose, Bld: 78 mg/dL (ref 70–99)
Potassium: 4 mEq/L (ref 3.5–5.1)
Sodium: 141 mEq/L (ref 135–145)

## 2010-05-19 LAB — TSH: TSH: 1.42 u[IU]/mL (ref 0.35–5.50)

## 2010-05-19 LAB — PSA: PSA: 0.61 ng/mL (ref 0.10–4.00)

## 2010-05-19 LAB — LDL CHOLESTEROL, DIRECT: Direct LDL: 172.8 mg/dL

## 2010-05-23 NOTE — Assessment & Plan Note (Signed)
Summary: REFILL THYROID MEDICATION/CLE   Vital Signs:  Patient profile:   40 year old male Height:      73 inches Weight:      313.25 pounds BMI:     41.48 Temp:     98.6 degrees F oral Pulse rate:   80 / minute Pulse rhythm:   regular BP sitting:   120 / 72  (left arm) Cuff size:   large  Vitals Entered By: Benny Lennert CMA Duncan Dull) (May 18, 2010 3:55 PM)  History of Present Illness: Chief complaint refill thyroid medication  40 year old male:  Went to [employer], so worked really hard, got down to 290. Then went back up to 330.  goal of 225.  hypothyroid: Feeling asymptomatic, but he has not had his thyroid levels checked in a year.  He is on 75 micrograms of Synthroid without problems.  Hyperlipidemia: Currently not on any medications, without any recent blood work.   Morbid obesity: BMI of 42. His weight has fluctuated a great deal, down to 290 at one point, and up to 3:30 at [facility] within the last year. Currently he is at 313 pounds, and attempting to lose weight.  Current Problems (verified): 1)  Morbid Obesity  (ICD-278.01) 2)  Special Screening Malignant Neoplasm of Prostate  (ICD-V76.44) 3)  Routine General Medical Exam@health  Care Facl  (ICD-V70.0) 4)  Back Pain, Lumbar  (ICD-724.2) 5)  Hypercholesterolemia  (ICD-272.0) 6)  Hypothyroidism  (ICD-244.9) 7)  Allergic Rhinitis  (ICD-477.9) 8)  Renal Calculus  (ICD-592.0) 9)  Fatigue  (ICD-780.79)  Allergies: 1)  ! Darvocet 2)  ! * Zyrtec 3)  ! Penicillin  Past History:  Past medical, surgical, family and social histories (including risk factors) reviewed, and no changes noted (except as noted below).  Past Medical History: Reviewed history from 05/11/2009 and no changes required. Allergic rhinitis Hypothyroidism Nephrolithiasis, hx of Hyperlipidemia  Past Surgical History: Reviewed history from 10/09/2007 and no changes required. R GANGLION CYSTECTOMY ESL- secondary to kidney stone  (DR [facility]) (11/2005) MRI lumbar spine--07/28/03--mild DDD L4-5. L5-S1  Family History: Reviewed history from 02/18/2008 and no changes required. Father A--bad heart disease, MI post cardiac arrest, elevated lipids Mother A  thyroid disease Sister A  Social History: Reviewed history from 02/18/2008 and no changes required. 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 CV: No chest pain or SOB GI: No noted N or V Otherwise, pertinent positives and negatives are noted in the HPI.   Physical Exam  Additional Exam:  GEN: WDWN, NAD, Non-toxic, A & O x 3 HEENT: Atraumatic, Normocephalic. Neck supple. No masses, No LAD. Ears and Nose: No external deformity. CV: RRR, No M/G/R. No JVD. No thrill. No extra heart sounds. PULM: CTA B, no wheezes, crackles, rhonchi. No retractions. No resp. distress. No accessory muscle use. EXTR: No c/c/e NEURO: Normal gait.  PSYCH: Normally interactive. Conversant. Not depressed or anxious appearing.  Calm demeanor.     Impression & Recommendations:  Problem # 1:  HYPOTHYROIDISM (ICD-244.9) check levels and get labs for multiple probs refill meds  His updated medication list for this problem includes:    Synthroid 75 Mcg Tabs (Levothyroxine sodium) .Marland Kitchen... Take 1 tablet by mouth once a day  Orders: Venipuncture (43329) TLB-TSH (Thyroid Stimulating Hormone) (84443-TSH)  Problem # 2:  HYPERCHOLESTEROLEMIA (ICD-272.0)  Orders: Venipuncture (51884)  TLB-TSH (Thyroid Stimulating Hormone) (84443-TSH)  Problem # 3:  MORBID OBESITY (ICD-278.01) urged to lose weight as top priorty  Problem # 4:  SPECIAL SCREENING MALIGNANT NEOPLASM OF PROSTATE (ICD-V76.44)  Orders: TLB-PSA (Prostate Specific Antigen) (84153-PSA)  Complete Medication List: 1)  Synthroid 75 Mcg Tabs (Levothyroxine sodium) .... Take 1 tablet by  mouth once a day  Other Orders: TLB-Cholesterol, Direct LDL (83721-DIRLDL) TLB-BMP (Basic Metabolic Panel-BMET) (80048-METABOL) TLB-CBC Platelet - w/Differential (85025-CBCD)   Orders Added: 1)  Venipuncture [04540] 2)  TLB-TSH (Thyroid Stimulating Hormone) [84443-TSH] 3)  TLB-Cholesterol, Direct LDL [83721-DIRLDL] 4)  TLB-BMP (Basic Metabolic Panel-BMET) [80048-METABOL] 5)  TLB-CBC Platelet - w/Differential [85025-CBCD] 6)  TLB-PSA (Prostate Specific Antigen) [84153-PSA] 7)  Est. Patient Level IV [98119]    Current Allergies (reviewed today): ! DARVOCET ! * ZYRTEC ! PENICILLIN

## 2010-05-29 ENCOUNTER — Other Ambulatory Visit: Payer: Self-pay | Admitting: *Deleted

## 2010-05-29 MED ORDER — LEVOTHYROXINE SODIUM 75 MCG PO TABS: 75.0000 ug | ORAL_TABLET | Freq: Every day | ORAL | Status: DC

## 2010-05-30 ENCOUNTER — Other Ambulatory Visit: Payer: Self-pay | Admitting: *Deleted

## 2010-07-21 NOTE — Assessment & Plan Note (Signed)
[facility]                                 ON-CALL NOTE   TALIS, IWAN                        MRN:          [id number]  DATE:05/06/2006                            DOB:          Jun 29, 1970    PRIMARY CARE Kadeja Granada:  Dr. Hetty Ely; home office is [facility].   TIME OF INTERACTION:  9:06 p.m.   PHONE NUMBER:  [phone]   OBJECTIVE:  The patient has flu symptoms with fever to 101 and saw  Billie this afternoon at noontime with headache, coughing, wheezing and  nausea.  Temperature has gone up to 101, which he did not have when  seen.  He was discharged with Claritin and Nasonex.  He is very  congested and wants to know what to do.  He does have Mucinex at home  and has ibuprofen, but has not taken either.  He was told to take 800 mg  of ibuprofen at the current time, to take Mucinex one now, drink lots of  fluids and if he wakes up in the middle of the night, take another  ibuprofen and call me in the morning if his temperature does not  improve; otherwise, I will be in touch tomorrow.     Arta Silence, MD  Electronically Signed    RNS/MedQ  DD: 05/06/2006  DT: 05/07/2006  Job #: [id number]

## 2010-08-14 ENCOUNTER — Other Ambulatory Visit: Payer: BC Managed Care – PPO

## 2011-05-02 ENCOUNTER — Ambulatory Visit (INDEPENDENT_AMBULATORY_CARE_PROVIDER_SITE_OTHER): Payer: BC Managed Care – PPO | Admitting: Family Medicine

## 2011-05-02 ENCOUNTER — Encounter: Payer: Self-pay | Admitting: Family Medicine

## 2011-05-02 VITALS — BP 130/78 | HR 95 | Temp 97.8°F | Ht 71.5 in | Wt 316.8 lb

## 2011-05-02 DIAGNOSIS — J069 Acute upper respiratory infection, unspecified: Secondary | ICD-10-CM

## 2011-05-02 MED ORDER — FLUTICASONE PROPIONATE 50 MCG/ACT NA SUSP: 2.0000 | Freq: Every day | NASAL | Status: DC

## 2011-05-02 MED ORDER — AZITHROMYCIN 250 MG PO TABS: ORAL_TABLET | ORAL | Status: AC

## 2011-05-02 NOTE — Progress Notes (Signed)
  Patient Name: Alex Newman Date of Birth: 08-Oct-1970 Medical Record Number: [id number]  History of Present Illness:  Patent presents with runny nose, sneezing, cough, sore throat, malaise and minimal / low-grade fever .   ? recent exposure to others with similar symptoms.  Severe congestion through nose. Since yesterday. Started Monday.   Nose spray - generic afrin. Prasying in nose.   The patent denies sore throat as the primary complaint. Denies sthortness of breath/wheezing, high fever, chest pain, rhinits for more than 14 days, significant myalgia, otalgia, facial pain, abdominal pain, changes in bowel or bladder.  PMH, PHS, Allergies, Problem List, Medications, Family History, and Social History have all been reviewed.  Review of Systems: as above, eating and drinking - tolerating PO. Urinating normally. No excessive vomitting or diarrhea. O/w as above.  Physical Exam:  Filed Vitals:   05/02/11 1454  BP: 130/78  Pulse: 95  Temp: 97.8 F (36.6 C)  TempSrc: Oral  Height: 5' 11.5" (1.816 m)  Weight: 316 lb 12.8 oz (143.7 kg)  SpO2: 95%    GEN: WDWN, Non-toxic, Atraumatic, normocephalic. A and O x 3. HEENT: Oropharynx clear without exudate, MMM, no significant LAD, mild rhinnorhea Ears: TM clear, COL visualized with good landmarks CV: RRR, no m/g/r. Pulm: CTA B, no wheezes, rhonchi, or crackles, normal respiratory effort. EXT: no c/c/e Psych: well oriented, neither depressed nor anxious in appearance  A/P: 1. URI. Supportive care reviewed with patient. See patient instruction section.  If not improved in a week, or fever, ok to start abx

## 2011-05-29 ENCOUNTER — Other Ambulatory Visit: Payer: Self-pay | Admitting: Family Medicine

## 2011-05-29 ENCOUNTER — Telehealth: Payer: Self-pay | Admitting: Family Medicine

## 2011-05-29 MED ORDER — LEVOTHYROXINE SODIUM 75 MCG PO TABS: 75.0000 ug | ORAL_TABLET | Freq: Every day | ORAL | Status: DC

## 2011-05-29 NOTE — Telephone Encounter (Signed)
Patient said he called and left 2 messages in the last 2 weeks and hasn't had a response.  Patient only has 2 Thyroid pills left.  Patient wants to know if you want him to come in for a follow up appointment,or have labwork done first.  Patient won't be able to come in until next week due to his work schedule.  Patient asked if a refill can be called in to his Pharmacy,[facility],until he can be seen or have his labwork done.

## 2011-05-29 NOTE — Telephone Encounter (Signed)
Please call the patient -- I refilled his thyroid medication. Please apologize. This is the first message I have received - please schedule a full CPX at his convenience this spring / summer   Phyllis, can you check why no one responded to this patient's messages and none are documented in his chart?

## 2011-05-29 NOTE — Telephone Encounter (Signed)
Patient advised and will call for cpx appt. Patient very understanding and appreciative

## 2011-05-30 NOTE — Telephone Encounter (Signed)
Spoke with patient and discussed the messages he left.  He remembers leaving name and why he was calling and call back number with short message but does not recall leaving his date of birth.  I apologized and explained that we should have done a better job with returning his message and that I would follow up with staff to be sure that they are returning calls/messages to be sure this does not happen. I asked if his needs had been met.  He said that Alex Newman had assisted him this morning and that he did not need anything further.  He was very pleasant and appreciated the call back to follow up.

## 2011-09-27 ENCOUNTER — Encounter: Payer: Self-pay | Admitting: Family Medicine

## 2011-09-27 ENCOUNTER — Ambulatory Visit (INDEPENDENT_AMBULATORY_CARE_PROVIDER_SITE_OTHER): Payer: BC Managed Care – PPO | Admitting: Family Medicine

## 2011-09-27 VITALS — BP 120/78 | HR 81 | Temp 98.3°F | Ht 71.0 in | Wt 317.8 lb

## 2011-09-27 DIAGNOSIS — E7800 Pure hypercholesterolemia, unspecified: Secondary | ICD-10-CM

## 2011-09-27 DIAGNOSIS — E039 Hypothyroidism, unspecified: Secondary | ICD-10-CM

## 2011-09-27 DIAGNOSIS — R5381 Other malaise: Secondary | ICD-10-CM

## 2011-09-27 DIAGNOSIS — Z0000 Encounter for general adult medical examination without abnormal findings: Secondary | ICD-10-CM

## 2011-09-27 DIAGNOSIS — Z79899 Other long term (current) drug therapy: Secondary | ICD-10-CM

## 2011-09-27 DIAGNOSIS — R5383 Other fatigue: Secondary | ICD-10-CM

## 2011-09-27 DIAGNOSIS — Z125 Encounter for screening for malignant neoplasm of prostate: Secondary | ICD-10-CM

## 2011-09-27 LAB — LIPID PANEL
Cholesterol: 209 mg/dL — ABNORMAL HIGH (ref 0–200)
HDL: 35.4 mg/dL — ABNORMAL LOW (ref >39.00)
Total CHOL/HDL Ratio: 6
Triglycerides: 168 mg/dL — ABNORMAL HIGH (ref 0.0–149.0)
VLDL: 33.6 mg/dL (ref 0.0–40.0)

## 2011-09-27 LAB — HEPATIC FUNCTION PANEL
ALT: 54 U/L — ABNORMAL HIGH (ref 0–53)
AST: 39 U/L — ABNORMAL HIGH (ref 0–37)
Albumin: 3.9 g/dL (ref 3.5–5.2)
Alkaline Phosphatase: 87 U/L (ref 39–117)
Bilirubin, Direct: 0.1 mg/dL (ref 0.0–0.3)
Total Bilirubin: 0.9 mg/dL (ref 0.3–1.2)
Total Protein: 7.1 g/dL (ref 6.0–8.3)

## 2011-09-27 LAB — BASIC METABOLIC PANEL
BUN: 9 mg/dL (ref 6–23)
CO2: 25 mEq/L (ref 19–32)
Calcium: 9.3 mg/dL (ref 8.4–10.5)
Chloride: 107 mEq/L (ref 96–112)
Creatinine, Ser: 1 mg/dL (ref 0.4–1.5)
GFR: 85.93 mL/min (ref >60.00)
Glucose, Bld: 103 mg/dL — ABNORMAL HIGH (ref 70–99)
Potassium: 4.4 mEq/L (ref 3.5–5.1)
Sodium: 140 mEq/L (ref 135–145)

## 2011-09-27 LAB — CBC WITH DIFFERENTIAL/PLATELET
Basophils Absolute: 0 10*3/uL (ref 0.0–0.1)
Basophils Relative: 0.5 % (ref 0.0–3.0)
Eosinophils Absolute: 0.1 10*3/uL (ref 0.0–0.7)
Eosinophils Relative: 1 % (ref 0.0–5.0)
HCT: 47.5 % (ref 39.0–52.0)
Hemoglobin: 15.9 g/dL (ref 13.0–17.0)
Lymphocytes Relative: 30.5 % (ref 12.0–46.0)
Lymphs Abs: 2.2 10*3/uL (ref 0.7–4.0)
MCHC: 33.5 g/dL (ref 30.0–36.0)
MCV: 90.6 fl (ref 78.0–100.0)
Monocytes Absolute: 0.4 10*3/uL (ref 0.1–1.0)
Monocytes Relative: 6.2 % (ref 3.0–12.0)
Neutro Abs: 4.5 10*3/uL (ref 1.4–7.7)
Neutrophils Relative %: 61.8 % (ref 43.0–77.0)
Platelets: 190 10*3/uL (ref 150.0–400.0)
RBC: 5.24 Mil/uL (ref 4.22–5.81)
RDW: 14.3 % (ref 11.5–14.6)
WBC: 7.2 10*3/uL (ref 4.5–10.5)

## 2011-09-27 LAB — PSA: PSA: 0.65 ng/mL (ref 0.10–4.00)

## 2011-09-27 LAB — LDL CHOLESTEROL, DIRECT: Direct LDL: 148.8 mg/dL

## 2011-09-27 LAB — TSH: TSH: 1.23 u[IU]/mL (ref 0.35–5.50)

## 2011-09-27 LAB — T4, FREE: Free T4: 0.86 ng/dL (ref 0.60–1.60)

## 2011-09-27 LAB — T3, FREE: T3, Free: 3.6 pg/mL (ref 2.3–4.2)

## 2011-09-27 NOTE — Progress Notes (Signed)
Nature conservation officer at [facility] [address] [place] [address] Phone: [phone] Fax: [phone]  Date:  09/27/2011   Name:  Alex Newman   DOB:  [birth year]   MRN:  [id number]  PCP:  Hannah Beat, MD    Chief Complaint: Annual Exam   History of Present Illness:  Alex Newman is a 41 y.o. very pleasant male patient who presents with the following:  Preventative Health Maintenance Visit:  Health Maintenance Summary Reviewed and updated, unless pt declines services.  Tobacco History Reviewed. Alcohol: No concerns, no excessive use Exercise Habits: Some activity, rec at least 30 mins 5 times a week STD concerns: no risk or activity to increase risk Drug Use: None Encouraged self-testicular check  Health Maintenance  Topic Date Due  . Tetanus/tdap  04/26/1988  . Influenza Vaccine  12/04/2011    Labs reviewed with the patient.  Results for orders placed in visit on 05/18/10  TSH      Component Value Range   TSH 1.42  0.35 - 5.50 uIU/mL  LDL CHOLESTEROL, DIRECT      Component Value Range   Direct LDL 172.8    BASIC METABOLIC PANEL      Component Value Range   Sodium 141  135 - 145 mEq/L   Potassium 4.0  3.5 - 5.1 mEq/L   Chloride 106  96 - 112 mEq/L   CO2 27  19 - 32 mEq/L   Glucose, Bld 78  70 - 99 mg/dL   BUN 13  6 - 23 mg/dL   Creatinine, Ser 1.2  0.4 - 1.5 mg/dL   Calcium 9.5  8.4 - 86.5 mg/dL   GFR 78.46  >96.29 mL/min  CBC WITH DIFFERENTIAL      Component Value Range   WBC 9.6  4.5 - 10.5 K/uL   RBC 5.00  4.22 - 5.81 Mil/uL   Hemoglobin 15.7  13.0 - 17.0 g/dL   HCT 52.8  41.3 - 24.4 %   MCV 89.5  78.0 - 100.0 fl   MCHC 35.0  30.0 - 36.0 g/dL   RDW 01.0  27.2 - 53.6 %   Platelets 208.0  150.0 - 400.0 K/uL   Neutrophils Relative 65.0  43.0 - 77.0 %   Lymphocytes Relative 28.5  12.0 - 46.0 %   Monocytes Relative 5.6  3.0 - 12.0 %   Eosinophils Relative 0.5  0.0 - 5.0 %   Basophils Relative 0.4  0.0 - 3.0 %   Neutro Abs 6.2  1.4  - 7.7 K/uL   Lymphs Abs 2.7  0.7 - 4.0 K/uL   Monocytes Absolute 0.5  0.1 - 1.0 K/uL   Eosinophils Absolute 0.0  0.0 - 0.7 K/uL   Basophils Absolute 0.0  0.0 - 0.1 K/uL  PSA      Component Value Range   PSA 0.61  0.10 - 4.00 ng/mL     Tiredness, no choking or stopping breathing.   2-3 BM every day.  Mornings a nutrigrain bar, mornings will be subway, no chicken sandwich.   Tries to do the treadmill a couple of times a week.   Wt Readings from Last 3 Encounters:  09/27/11 317 lb 12 oz (144.13 kg)  05/02/11 316 lb 12.8 oz (143.7 kg)  05/18/10 313 lb 4 oz (142.089 kg)   Check thyroid.   Patient Active Problem List  Diagnosis  . HYPERCHOLESTEROLEMIA  . ALLERGIC RHINITIS  . RENAL CALCULUS  . BACK PAIN, LUMBAR  .  FATIGUE  . HYPOTHYROIDISM  . MORBID OBESITY    Past Medical History  Diagnosis Date  . Allergy   . Thyroid disease   . Hyperlipidemia   . Nephrolithiasis     Past Surgical History  Procedure Date  . Ganglion cyst excision     right    History  Substance Use Topics  . Smoking status: Never Smoker   . Smokeless tobacco: Not on file  . Alcohol Use: No    Family History  Problem Relation Age of Onset  . Thyroid disease Mother   . Heart disease Father   . Hyperlipidemia Father     Allergies  Allergen Reactions  . Cetirizine Hcl     REACTION: Headache  . Penicillins   . Propoxyphene-Acetaminophen     REACTION: ha, nausea    Medication list has been reviewed and updated.  Current Outpatient Prescriptions on File Prior to Visit  Medication Sig Dispense Refill  . levothyroxine (SYNTHROID, LEVOTHROID) 75 MCG tablet Take 1 tablet (75 mcg total) by mouth daily.  30 tablet  3  . fluticasone (FLONASE) 50 MCG/ACT nasal spray Place 2 sprays into the nose daily.  16 g  12    Review of Systems:   General: Denies fever, chills, sweats. No significant weight loss. + FATIGUE Eyes: Denies blurring,significant itching ENT: Denies earache, sore throat,  and hoarseness. Cardiovascular: Denies chest pains, palpitations, dyspnea on exertion Respiratory: Denies cough, dyspnea at rest,wheeezing Breast: no concerns about lumps GI: Denies nausea, vomiting, diarrhea, constipation, change in bowel habits, abdominal pain, melena, hematochezia GU: Denies penile discharge, ED, urinary flow / outflow problems. No STD concerns. Musculoskeletal: Denies back pain, joint pain Derm: Denies rash, itching Neuro: Denies  paresthesias, frequent falls, frequent headaches Psych: Denies depression, anxiety Endocrine: Denies cold intolerance, heat intolerance, polydipsia Heme: Denies enlarged lymph nodes Allergy: No hayfever   Physical Examination: Filed Vitals:   09/27/11 0805  BP: 120/78  Pulse: 81  Temp: 98.3 F (36.8 C)   Filed Vitals:   09/27/11 0805  Height: 5\' 11"  (1.803 m)  Weight: 317 lb 12 oz (144.13 kg)   Body mass index is 44.32 kg/(m^2). Ideal Body Weight: Weight in (lb) to have BMI = 25: 178.9    Wt Readings from Last 3 Encounters:  09/27/11 317 lb 12 oz (144.13 kg)  05/02/11 316 lb 12.8 oz (143.7 kg)  05/18/10 313 lb 4 oz (142.089 kg)    GEN: well developed, well nourished, no acute distress Eyes: conjunctiva and lids normal, PERRLA, EOMI ENT: TM clear, nares clear, oral exam WNL Neck: supple, no lymphadenopathy, no thyromegaly, no JVD Pulm: clear to auscultation and percussion, respiratory effort normal CV: regular rate and rhythm, S1-S2, no murmur, rub or gallop, no bruits, peripheral pulses normal and symmetric, no cyanosis, clubbing, edema or varicosities Chest: no scars, masses GI: soft, non-tender; no hepatosplenomegaly, masses; active bowel sounds all quadrants GU: no hernia, testicular mass, penile discharge, or prostate enlargement Lymph: no cervical, axillary or inguinal adenopathy MSK: gait normal, muscle tone and strength WNL, no joint swelling, effusions, discoloration, crepitus  SKIN: clear, good turgor, color  WNL, no rashes, lesions, or ulcerations Neuro: normal mental status, normal strength, sensation, and motion Psych: alert; oriented to person, place and time, normally interactive and not anxious or depressed in appearance.   Assessment and Plan: 1. Routine general medical examination at a health care facility    2. HYPERCHOLESTEROLEMIA  Lipid panel  3. HYPOTHYROIDISM  T4, free, T3, free, TSH  4. Encounter for long-term (current) use of other medications    5. Other malaise and fatigue  Basic metabolic panel, CBC with Differential, Hepatic function panel, Testosterone, free, total  6. Special screening for malignant neoplasm of prostate  PSA    The patient's preventative maintenance and recommended screening tests for an annual wellness exam were reviewed in full today. Brought up to date unless services declined.  Counselled on the importance of diet, exercise, and its role in overall health and mortality. The patient's FH and SH was reviewed, including their home life, tobacco status, and drug and alcohol status.    With fatigue, check thyroid, test. All other labs  Hannah Beat, MD

## 2011-09-28 LAB — TESTOSTERONE, FREE, TOTAL, SHBG
Sex Hormone Binding: 19 nmol/L (ref 13–71)
Testosterone, Free: 39.8 pg/mL — ABNORMAL LOW (ref 47.0–244.0)
Testosterone-% Free: 2.5 % (ref 1.6–2.9)
Testosterone: 159.66 ng/dL — ABNORMAL LOW (ref 300–890)

## 2011-10-04 ENCOUNTER — Other Ambulatory Visit: Payer: Self-pay | Admitting: *Deleted

## 2011-10-04 MED ORDER — LEVOTHYROXINE SODIUM 75 MCG PO TABS: 75.0000 ug | ORAL_TABLET | Freq: Every day | ORAL | Status: DC

## 2011-10-17 ENCOUNTER — Other Ambulatory Visit: Payer: Self-pay | Admitting: Family Medicine

## 2011-10-17 DIAGNOSIS — R5381 Other malaise: Secondary | ICD-10-CM

## 2011-10-17 DIAGNOSIS — R7989 Other specified abnormal findings of blood chemistry: Secondary | ICD-10-CM

## 2011-10-22 ENCOUNTER — Other Ambulatory Visit (INDEPENDENT_AMBULATORY_CARE_PROVIDER_SITE_OTHER): Payer: BC Managed Care – PPO

## 2011-10-22 DIAGNOSIS — R5381 Other malaise: Secondary | ICD-10-CM

## 2011-10-22 DIAGNOSIS — R5383 Other fatigue: Secondary | ICD-10-CM

## 2011-10-22 DIAGNOSIS — R7989 Other specified abnormal findings of blood chemistry: Secondary | ICD-10-CM

## 2011-10-23 LAB — TESTOSTERONE, FREE, TOTAL, SHBG
Sex Hormone Binding: 23 nmol/L (ref 13–71)
Testosterone, Free: 49.2 pg/mL (ref 47.0–244.0)
Testosterone-% Free: 2.3 % (ref 1.6–2.9)
Testosterone: 211.92 ng/dL — ABNORMAL LOW (ref 300–890)

## 2011-11-19 ENCOUNTER — Ambulatory Visit (INDEPENDENT_AMBULATORY_CARE_PROVIDER_SITE_OTHER): Payer: BC Managed Care – PPO | Admitting: Family Medicine

## 2011-11-19 ENCOUNTER — Encounter: Payer: Self-pay | Admitting: Family Medicine

## 2011-11-19 VITALS — BP 134/90 | HR 93 | Temp 98.4°F | Ht 71.0 in | Wt 323.5 lb

## 2011-11-19 DIAGNOSIS — E291 Testicular hypofunction: Secondary | ICD-10-CM

## 2011-11-19 NOTE — Progress Notes (Signed)
   Nature conservation officer at [facility] [address] [place] [address] Phone: [phone] Fax: [phone]  Date:  11/19/2011   Name:  Alex Newman   DOB:  12/16/1970   MRN:  [id number] Gender: male Age: 41 y.o.  PCP:  Hannah Beat, MD    Chief Complaint: Follow-up   History of Present Illness:  Alex Newman is a 41 y.o. very pleasant male patient who presents with the following:  F/u low testosterone:  Lays on stomach, side.  NO snoring really.   Past Medical History, Surgical History, Social History, Family History, Problem List, Medications, and Allergies have been reviewed and updated if relevant.  Current Outpatient Prescriptions on File Prior to Visit  Medication Sig Dispense Refill  . levothyroxine (SYNTHROID, LEVOTHROID) 75 MCG tablet Take 1 tablet (75 mcg total) by mouth daily.  30 tablet  11  . fluticasone (FLONASE) 50 MCG/ACT nasal spray Place 2 sprays into the nose daily.  16 g  12   >15 minutes spent in face to face time with patient, >50% spent in counselling or coordination of care: spent in discussion regarding testosterone supplementation including dosing, risks of Prostate CA, potential decreased  Risk of DM, increase muscle mass, decrease fat content. For now, he wants to think about it and will call me back.

## 2012-09-29 ENCOUNTER — Other Ambulatory Visit: Payer: Self-pay | Admitting: *Deleted

## 2012-09-29 MED ORDER — LEVOTHYROXINE SODIUM 75 MCG PO TABS: 75.0000 ug | ORAL_TABLET | Freq: Every day | ORAL | Status: DC

## 2012-10-27 ENCOUNTER — Other Ambulatory Visit: Payer: Self-pay | Admitting: Family Medicine

## 2012-10-27 MED ORDER — LEVOTHYROXINE SODIUM 75 MCG PO TABS: 75.0000 ug | ORAL_TABLET | Freq: Every day | ORAL | Status: DC

## 2012-10-27 NOTE — Telephone Encounter (Signed)
Pt has appt for lab work next Wednesday but does not have enough medication to last until then.  Refilled x 1 month.

## 2012-11-05 ENCOUNTER — Encounter: Payer: Self-pay | Admitting: Family Medicine

## 2012-11-05 ENCOUNTER — Ambulatory Visit (INDEPENDENT_AMBULATORY_CARE_PROVIDER_SITE_OTHER): Payer: BC Managed Care – PPO | Admitting: Family Medicine

## 2012-11-05 VITALS — BP 126/90 | HR 85 | Temp 98.3°F | Ht 71.0 in | Wt 334.5 lb

## 2012-11-05 DIAGNOSIS — E039 Hypothyroidism, unspecified: Secondary | ICD-10-CM

## 2012-11-05 DIAGNOSIS — Z131 Encounter for screening for diabetes mellitus: Secondary | ICD-10-CM

## 2012-11-05 DIAGNOSIS — R5381 Other malaise: Secondary | ICD-10-CM

## 2012-11-05 DIAGNOSIS — Z13220 Encounter for screening for lipoid disorders: Secondary | ICD-10-CM

## 2012-11-05 LAB — BASIC METABOLIC PANEL
BUN: 10 mg/dL (ref 6–23)
CO2: 25 mEq/L (ref 19–32)
Calcium: 9.4 mg/dL (ref 8.4–10.5)
Chloride: 105 mEq/L (ref 96–112)
Creatinine, Ser: 0.9 mg/dL (ref 0.4–1.5)
GFR: 96.41 mL/min (ref >60.00)
Glucose, Bld: 97 mg/dL (ref 70–99)
Potassium: 4.2 mEq/L (ref 3.5–5.1)
Sodium: 139 mEq/L (ref 135–145)

## 2012-11-05 LAB — T4, FREE: Free T4: 0.84 ng/dL (ref 0.60–1.60)

## 2012-11-05 LAB — LIPID PANEL
Cholesterol: 235 mg/dL — ABNORMAL HIGH (ref 0–200)
HDL: 37.5 mg/dL — ABNORMAL LOW (ref >39.00)
Total CHOL/HDL Ratio: 6
Triglycerides: 151 mg/dL — ABNORMAL HIGH (ref 0.0–149.0)
VLDL: 30.2 mg/dL (ref 0.0–40.0)

## 2012-11-05 LAB — LDL CHOLESTEROL, DIRECT: Direct LDL: 177.1 mg/dL

## 2012-11-05 LAB — T3, FREE: T3, Free: 3.3 pg/mL (ref 2.3–4.2)

## 2012-11-05 LAB — TSH: TSH: 1.49 u[IU]/mL (ref 0.35–5.50)

## 2012-11-05 NOTE — Progress Notes (Signed)
Nature conservation officer at [facility] [address] [place] [address] Phone: [phone] Fax: [phone]  Date:  11/05/2012   Name:  Alex Newman   DOB:  December 15, 1970   MRN:  [id number] Gender: male Age: 42 y.o.  Primary Physician:  Hannah Beat, MD  Evaluating MD: Hannah Beat, MD   Chief Complaint: Hypothyroidism   History of Present Illness:  Alex Newman is a 42 y.o. pleasant patient who presents with the following:  Feels tired most of the day.  Financial planner at [employer].  Thyroid: No symptoms. Labs reviewed. Denies cold / heat intolerance, dry skin, hair loss. No goiter. He feels tired much of the time and he wonders if he is having low levels of thyroid. He states that he feels like he feels tired and feels similar to like he did when previously had been on [employer]our Thyroid as well.  he was initially diagnosed.   he also has a history of hyperlipidemia and obesity. He has not had that checked in some time.   Lab Results  Component Value Date   TSH 1.49 11/05/2012   Lab Results  Component Value Date   TSH 1.49 11/05/2012   T4TOTAL 9.8 05/11/2009   Results for orders placed in visit on 11/05/12  TSH      Result Value Range   TSH 1.49  0.35 - 5.50 uIU/mL  T4, FREE      Result Value Range   Free T4 0.84  0.60 - 1.60 ng/dL  T3, FREE      Result Value Range   T3, Free 3.3  2.3 - 4.2 pg/mL  BASIC METABOLIC PANEL      Result Value Range   Sodium 139  135 - 145 mEq/L   Potassium 4.2  3.5 - 5.1 mEq/L   Chloride 105  96 - 112 mEq/L   CO2 25  19 - 32 mEq/L   Glucose, Bld 97  70 - 99 mg/dL   BUN 10  6 - 23 mg/dL   Creatinine, Ser 0.9  0.4 - 1.5 mg/dL   Calcium 9.4  8.4 - 86.5 mg/dL   GFR 78.46  >96.29 mL/min  LIPID PANEL      Result Value Range   Cholesterol 235 (*) 0 - 200 mg/dL   Triglycerides 528.4 (*) 0.0 - 149.0 mg/dL   HDL 13.24 (*) >40.10 mg/dL   VLDL 27.2  0.0 - 53.6 mg/dL   Total CHOL/HDL Ratio 6    LDL CHOLESTEROL, DIRECT   Result Value Range   Direct LDL 177.1       Patient Active Problem List   Diagnosis Date Noted  . Hypogonadism male 11/19/2011  . MORBID OBESITY 05/18/2010  . BACK PAIN, LUMBAR 09/29/2007  . HYPERCHOLESTEROLEMIA 07/10/2007  . ALLERGIC RHINITIS 07/10/2007  . RENAL CALCULUS 07/10/2007  . HYPOTHYROIDISM 07/10/2007  . FATIGUE 06/19/2007    Past Medical History  Diagnosis Date  . Allergy   . Thyroid disease   . Hyperlipidemia   . Nephrolithiasis   . Hypogonadism male 11/19/2011    Past Surgical History  Procedure Laterality Date  . Ganglion cyst excision      right    History   Social History  . Marital Status: Married    Spouse Name: N/A    Number of Children: 2  . Years of Education: N/A   Occupational History  . parts Programmer, [employer]    Social History Main Topics  . Smoking status:  Never Smoker   . Smokeless tobacco: Never Used  . Alcohol Use: No  . Drug Use: No  . Sexual Activity: Not on file   Other Topics Concern  . Not on file   Social History Narrative  . No narrative on file    Family History  Problem Relation Age of Onset  . Thyroid disease Mother   . Heart disease Father   . Hyperlipidemia Father     Allergies  Allergen Reactions  . Cetirizine Hcl     REACTION: Headache  . Penicillins   . Propoxyphene-Acetaminophen     REACTION: ha, nausea    Medication list has been reviewed and updated.  Outpatient Prescriptions Prior to Visit  Medication Sig Dispense Refill  . levothyroxine (SYNTHROID, LEVOTHROID) 75 MCG tablet Take 1 tablet (75 mcg total) by mouth daily.  30 tablet  0  . fluticasone (FLONASE) 50 MCG/ACT nasal spray Place 2 sprays into the nose daily.  16 g  12   No facility-administered medications prior to visit.    Review of Systems:   fatigue. No chest pain, shortness of breath. Denies snoring. Denies stopping breathing while he is sleeping.   Physical Examination: BP 126/90  Pulse 85  Temp(Src) 98.3 F (36.8  C) (Oral)  Ht 5\' 11"  (1.803 m)  Wt 334 lb 8 oz (151.728 kg)  BMI 46.67 kg/m2  Ideal Body Weight: Weight in (lb) to have BMI = 25: 178.9   GEN: WDWN, NAD, Non-toxic, A & O x 3 HEENT: Atraumatic, Normocephalic. Neck supple. No masses, No LAD. Ears and Nose: No external deformity. CV: RRR, No M/G/R. No JVD. No thrill. No extra heart sounds. PULM: CTA B, no wheezes, crackles, rhonchi. No retractions. No resp. distress. No accessory muscle use. EXTR: No c/c/e NEURO Normal gait.  PSYCH: Normally interactive. Conversant. Not depressed or anxious appearing.  Calm demeanor.    Assessment and Plan:  HYPOTHYROIDISM - Plan: TSH, T4, free, T3, free  Screening for diabetes mellitus - Plan: Basic metabolic panel  Screening for lipoid disorders - Plan: Lipid panel  Other malaise and fatigue  Check thyroid, and adjust based on results. Reasonable to switch back to [employer]our Thyroid if he would like.  Lipids are now stable. Strongly caution weight loss and exercise  Orders Today:  Orders Placed This Encounter  Procedures  . TSH  . T4, free  . T3, free  . Basic metabolic panel  . Lipid panel  . LDL cholesterol, direct    Updated Medication List: (Includes new medications, updates to list, dose adjustments) Meds ordered this encounter  Medications  . pseudoephedrine-acetaminophen (TYLENOL SINUS) 30-500 MG TABS    Sig: Take 2 tablets by mouth as needed.    Medications Discontinued: There are no discontinued medications.    Signed, Elpidio Galea. Fatina Sprankle, MD 11/05/2012 10:46 AM

## 2012-11-07 MED ORDER — LEVOTHYROXINE SODIUM 75 MCG PO TABS: 75.0000 ug | ORAL_TABLET | Freq: Every day | ORAL | Status: DC

## 2012-11-07 NOTE — Addendum Note (Signed)
Addended by: Hannah Beat on: 11/07/2012 09:31 AM   Modules accepted: Orders

## 2013-02-11 ENCOUNTER — Ambulatory Visit (INDEPENDENT_AMBULATORY_CARE_PROVIDER_SITE_OTHER): Payer: BC Managed Care – PPO | Admitting: Family Medicine

## 2013-02-11 ENCOUNTER — Encounter: Payer: Self-pay | Admitting: Family Medicine

## 2013-02-11 VITALS — BP 120/76 | HR 79 | Temp 97.6°F | Ht 71.0 in | Wt 332.0 lb

## 2013-02-11 DIAGNOSIS — K5289 Other specified noninfective gastroenteritis and colitis: Secondary | ICD-10-CM

## 2013-02-11 DIAGNOSIS — R1013 Epigastric pain: Secondary | ICD-10-CM

## 2013-02-11 DIAGNOSIS — K3189 Other diseases of stomach and duodenum: Secondary | ICD-10-CM

## 2013-02-11 DIAGNOSIS — R112 Nausea with vomiting, unspecified: Secondary | ICD-10-CM

## 2013-02-11 DIAGNOSIS — K219 Gastro-esophageal reflux disease without esophagitis: Secondary | ICD-10-CM

## 2013-02-11 DIAGNOSIS — K529 Noninfective gastroenteritis and colitis, unspecified: Secondary | ICD-10-CM

## 2013-02-11 HISTORY — DX: Gastro-esophageal reflux disease without esophagitis: K21.9

## 2013-02-11 MED ORDER — OMEPRAZOLE 20 MG PO CPDR: 20.0000 mg | DELAYED_RELEASE_CAPSULE | Freq: Every day | ORAL | Status: DC

## 2013-02-11 MED ORDER — ONDANSETRON 4 MG PO TBDP
4.0000 mg | ORAL_TABLET | Freq: Once | ORAL | Status: AC
  Administered 2013-02-11: 4 mg via ORAL

## 2013-02-11 MED ORDER — ONDANSETRON 8 MG PO TBDP: 8.0000 mg | ORAL_TABLET | Freq: Three times a day (TID) | ORAL | Status: DC | PRN

## 2013-02-11 NOTE — Progress Notes (Signed)
Date:  02/11/2013   Name:  Alex Newman   DOB:  22-Oct-1970   MRN:  [id number] Gender: male Age: 42 y.o.  Primary Physician:  Hannah Beat, MD   Chief Complaint: Nausea and Diarrhea   Subjective:   History of Present Illness:  Alex Newman is a 41 y.o. very pleasant male patient who presents with the following:  Nauseated, for three weeks, he will wake up and have nausea. This morning he woke up and had diarrhea. Did not have anything on his stomach. Could not stand the nausea. Got home and took some nyquil. Acute diarrhea and nausea in the last few days, has had nausea in am for months  N and diarrhea.  No energy at all. Sees people all day long. 50 people a day.   Body mass index is 46.33 kg/(m^2).    Past Medical History, Surgical History, Social History, Family History, Problem List, Medications, and Allergies have been reviewed and updated if relevant.  ROS: GEN: Acute illness details above GI: Tolerating PO intake, n/v/d GU: maintaining adequate hydration and urination Pulm: No SOB Interactive and getting along well at home.  Otherwise, ROS is as per the HPI.   Objective:   Physical Examination: BP 120/76  Pulse 79  Temp(Src) 97.6 F (36.4 C) (Oral)  Ht 5\' 11"  (1.803 m)  Wt 332 lb (150.594 kg)  BMI 46.33 kg/m2   GEN: WDWN, NAD, Non-toxic, A & O x 3 HEENT: Atraumatic, Normocephalic. Neck supple. No masses, No LAD. Ears and Nose: No external deformity. CV: RRR, No M/G/R. No JVD. No thrill. No extra heart sounds. PULM: CTA B, no wheezes, crackles, rhonchi. No retractions. No resp. distress. No accessory muscle use. ABD: S, NT, ND, +hyperactive BS, No rebound, No HSM  EXTR: No c/c/e NEURO Normal gait.  PSYCH: Normally interactive. Conversant. Not depressed or anxious appearing.  Calm demeanor.   Laboratory and Imaging Data:  Assessment & Plan:    Nausea with vomiting - Plan: ondansetron (ZOFRAN-ODT) disintegrating tablet 4 mg  Acute  gastroenteritis  Dyspepsia  GERD (gastroesophageal reflux disease)  Acute gi illness, then start ppi  PUD and Dyspepsia  Often caused by H. Pylori bacteria or NSAID use  1. Treat H. Pylori if present 2. Stop aspirin and NSAID use 3. If mild, OTC Zantac or Pepcid can help, treat 6-12 weeks 4. If severe, prescription likely needed, 3-6 months 5. Smoking, alcohol, caffeine can make worse 6. Don't eat foods that make you worse   Orders Today:  No orders of the defined types were placed in this encounter.    New medications, updates to list, dose adjustments: Meds ordered this encounter  Medications  . ondansetron (ZOFRAN-ODT) 8 MG disintegrating tablet    Sig: Take 1 tablet (8 mg total) by mouth every 8 (eight) hours as needed for nausea or vomiting.    Dispense:  30 tablet    Refill:  0  . omeprazole (PRILOSEC) 20 MG capsule    Sig: Take 1 capsule (20 mg total) by mouth daily.    Dispense:  30 capsule    Refill:  11  . ondansetron (ZOFRAN-ODT) disintegrating tablet 4 mg    Sig:     Signed,  Sheree Lalla T. Erasmus Bistline, MD, CAQ Sports Medicine  [facility] at [facility] [address] [place] [address] Phone: [phone] Fax: [phone]  Updated Complete Medication List:   Medication List       This list is accurate as  of: 02/11/13  5:54 PM.  Always use your most recent med list.               levothyroxine 75 MCG tablet  Commonly known as:  SYNTHROID, LEVOTHROID  Take 1 tablet (75 mcg total) by mouth daily.     omeprazole 20 MG capsule  Commonly known as:  PRILOSEC  Take 1 capsule (20 mg total) by mouth daily.     ondansetron 8 MG disintegrating tablet  Commonly known as:  ZOFRAN-ODT  Take 1 tablet (8 mg total) by mouth every 8 (eight) hours as needed for nausea or vomiting.     pseudoephedrine-acetaminophen 30-500 MG Tabs  Commonly known as:  TYLENOL SINUS  Take 2 tablets by mouth as needed.

## 2013-02-11 NOTE — Progress Notes (Signed)
Pre-visit discussion using our clinic review tool. No additional management support is needed unless otherwise documented below in the visit note.  

## 2013-02-16 ENCOUNTER — Telehealth: Payer: Self-pay | Admitting: *Deleted

## 2013-02-16 NOTE — Telephone Encounter (Signed)
Recieved prior auth request from pharmacy for ondansetron 8mg . I obtained authorization verbally from insurance, and I notified pharmacy of approval via fax. Waiting on approval notice from insurance company to arrive via fax.

## 2013-02-17 NOTE — Telephone Encounter (Signed)
Approval form received by insurance company. Will send to provider for signature, then to be scanned into patients medical record.  

## 2013-02-17 NOTE — Telephone Encounter (Signed)
This is probably too late  If he did not get med, then we can sub in phenergan 25 mg, 1 po q 8 hours prn nausea, #30, 0 ref  If he wants zofran, i can complete tomorrow

## 2013-05-27 ENCOUNTER — Telehealth: Payer: Self-pay

## 2013-05-27 NOTE — Telephone Encounter (Signed)
Pt discussed with Dr Patsy Lageropland 11/2012 about switching back to Armour thyroid and pt has decided he does want to switch to Armour thyroid. [facility]. Pt is presently taking levothyroxine 75 mcg taking one daily. Pt said cb next week is OK.Please advise.

## 2013-05-30 NOTE — Telephone Encounter (Signed)
Noted will work with pharmacist to convert to Armour equivalent.

## 2013-06-01 MED ORDER — THYROID 15 MG PO TABS: 45.0000 mg | ORAL_TABLET | Freq: Every day | ORAL | Status: DC

## 2013-06-01 NOTE — Telephone Encounter (Signed)
Let Kala know i converted over to armour thyroid, [facility].  He should follow-up in a month and we can recheck his levels.  Hannah BeatSpencer Matvey Llanas, MD

## 2013-06-02 NOTE — Telephone Encounter (Signed)
[name] notified as instructed by telephone.  He states he just filled the levothyroxine so he wants to use that up first before starting the Armour thyroid. Advised when he has been on the Armour thyroid for about a month to call and schedule follow up appointment with Dr. Patsy Lageropland.  Patient states understanding.

## 2013-07-01 ENCOUNTER — Telehealth: Payer: Self-pay | Admitting: Family Medicine

## 2013-07-01 NOTE — Telephone Encounter (Signed)
Patient Information:  Caller Name: Gery PrayBarry  Phone: [phone]  Patient: Alex Newman, Alex Newman  Gender: Male  DOB: 1970/09/10  Age: 43 Years  PCP: Hannah Beatopland, Spencer [facility](Family Practice)  Office Follow Up:  Does the office need to follow up with this patient?: No  Instructions For The Office: N/A  RN Note:  Home care advice and call back parameters reviewed. Encouraged to closely mointor and call back for questions, changes or concerns. Understanding expressed.  Symptoms  Reason For Call & Symptoms: Patient states that he was constipated from Saturday 06/27/13 until last night 06/30/13.  He treated himself with prune juice at 19:00 pm .  BM started from 12 midnight until 04:00 a.m. the stool was soft to diarrhea.  Good results obtained. No blood or black stool noted. No abdominal pain or fever.    He felt nauseated after the BM and had emesis x2 since 06:00 a.m.  The nausea is now passing.  No bloating or discomfort.  Reviewed Health History In EMR: Yes  Reviewed Medications In EMR: Yes  Reviewed Allergies In EMR: Yes  Reviewed Surgeries / Procedures: Yes  Date of Onset of Symptoms: 07/01/2013  Treatments Tried: Prune juice  Treatments Tried Worked: Yes  Guideline(s) Used:  Constipation  Vomiting  Disposition Per Guideline:   Home Care  Reason For Disposition Reached:   Vomiting  Advice Given:  General Constipation Instructions:  Eat a high fiber diet.  Drink adequate liquids.  Exercise regularly (even a daily 15 minute walk!).  Get into a rhythm - try to have a BM at the same time each day.  Don't ignore your body's signals to have a BM.  Avoid enemas and stimulant laxatives.  High Fiber Diet:  Try to eat fresh fruit and vegetables at each meal (peas, prunes, citrus, apples, beans, corn).  Eat more grain foods (bran flakes, bran muffins, graham crackers, oatmeal, brown rice, and whole wheat bread). Popcorn is a source of fiber.  Liquids:  Drink 6-8 glasses of water a day (Caution:  certain medical conditions require fluid restriction).  Prune juice is a natural laxative.  Avoid alcohol.  Call Back If:  Constipation continues (i.e., less than 3 BMs / week or straining more than 25% of the time) after following care advice for constipation for 2 weeks  You become worse  Clear Liquids:  Sip water or a rehydration drink (e.g., Gatorade or Powerade).  Clear Liquids:  Sip water or a rehydration drink (e.g., Gatorade or Powerade).  Other options: 1/2 strength flat lemon-lime soda or ginger ale.  After 4 hours without vomiting, increase the amount.  Expected Course:  Vomiting from viral gastritis usually stops in 12 to 48 hours.  For Non-stop Vomiting, Try Sleeping:  Try to go to sleep (Reason: sleep often empties the stomach and relieves the need to vomit).  When you awaken, resume drinking liquids. Water works best initially.  Call Back If:  Vomiting lasts for more than 2 days (48 hours)  Signs of dehydration occur  You become worse.  Patient Will Follow Care Advice:  YES

## 2013-08-19 ENCOUNTER — Other Ambulatory Visit: Payer: Self-pay | Admitting: Family Medicine

## 2013-08-19 DIAGNOSIS — E039 Hypothyroidism, unspecified: Secondary | ICD-10-CM

## 2013-08-24 ENCOUNTER — Other Ambulatory Visit (INDEPENDENT_AMBULATORY_CARE_PROVIDER_SITE_OTHER): Payer: BC Managed Care – PPO

## 2013-08-24 DIAGNOSIS — E039 Hypothyroidism, unspecified: Secondary | ICD-10-CM

## 2013-08-24 LAB — TSH: TSH: 2.05 u[IU]/mL (ref 0.35–4.50)

## 2013-08-24 LAB — T3, FREE: T3, Free: 2.9 pg/mL (ref 2.3–4.2)

## 2013-08-24 LAB — T4, FREE: Free T4: 0.77 ng/dL (ref 0.60–1.60)

## 2013-09-09 ENCOUNTER — Other Ambulatory Visit: Payer: Self-pay | Admitting: Family Medicine

## 2013-09-09 NOTE — Telephone Encounter (Signed)
Pt request status of armour thyroid;[facility] and rx ready for pick up. Pt notified and voiced understanding.

## 2013-10-12 ENCOUNTER — Other Ambulatory Visit: Payer: Self-pay | Admitting: Family Medicine

## 2013-10-12 NOTE — Telephone Encounter (Signed)
Last office visit 02/11/2013.  Ok to refill?

## 2013-10-20 ENCOUNTER — Ambulatory Visit (INDEPENDENT_AMBULATORY_CARE_PROVIDER_SITE_OTHER): Payer: BC Managed Care – PPO | Admitting: Family Medicine

## 2013-10-20 ENCOUNTER — Encounter: Payer: Self-pay | Admitting: Family Medicine

## 2013-10-20 VITALS — BP 124/82 | HR 88 | Temp 98.1°F | Ht 71.0 in | Wt 332.8 lb

## 2013-10-20 DIAGNOSIS — R002 Palpitations: Secondary | ICD-10-CM | POA: Insufficient documentation

## 2013-10-20 DIAGNOSIS — R110 Nausea: Secondary | ICD-10-CM | POA: Insufficient documentation

## 2013-10-20 LAB — LIPASE: Lipase: 20 U/L (ref 11.0–59.0)

## 2013-10-20 LAB — CBC WITH DIFFERENTIAL/PLATELET
Basophils Absolute: 0 10*3/uL (ref 0.0–0.1)
Basophils Relative: 0.5 % (ref 0.0–3.0)
Eosinophils Absolute: 0 10*3/uL (ref 0.0–0.7)
Eosinophils Relative: 0.4 % (ref 0.0–5.0)
HCT: 48 % (ref 39.0–52.0)
Hemoglobin: 16.2 g/dL (ref 13.0–17.0)
Lymphocytes Relative: 27.6 % (ref 12.0–46.0)
Lymphs Abs: 2.5 10*3/uL (ref 0.7–4.0)
MCHC: 33.7 g/dL (ref 30.0–36.0)
MCV: 88.6 fl (ref 78.0–100.0)
Monocytes Absolute: 0.5 10*3/uL (ref 0.1–1.0)
Monocytes Relative: 5.9 % (ref 3.0–12.0)
Neutro Abs: 5.9 10*3/uL (ref 1.4–7.7)
Neutrophils Relative %: 65.6 % (ref 43.0–77.0)
Platelets: 206 10*3/uL (ref 150.0–400.0)
RBC: 5.42 Mil/uL (ref 4.22–5.81)
RDW: 13.8 % (ref 11.5–15.5)
WBC: 9 10*3/uL (ref 4.0–10.5)

## 2013-10-20 LAB — COMPREHENSIVE METABOLIC PANEL
ALT: 43 U/L (ref 0–53)
AST: 29 U/L (ref 0–37)
Albumin: 3.8 g/dL (ref 3.5–5.2)
Alkaline Phosphatase: 86 U/L (ref 39–117)
BUN: 9 mg/dL (ref 6–23)
CO2: 27 mEq/L (ref 19–32)
Calcium: 9.6 mg/dL (ref 8.4–10.5)
Chloride: 109 mEq/L (ref 96–112)
Creatinine, Ser: 1.1 mg/dL (ref 0.4–1.5)
GFR: 74 mL/min (ref >60.00)
Glucose, Bld: 96 mg/dL (ref 70–99)
Potassium: 4.3 mEq/L (ref 3.5–5.1)
Sodium: 145 mEq/L (ref 135–145)
Total Bilirubin: 0.7 mg/dL (ref 0.2–1.2)
Total Protein: 7.4 g/dL (ref 6.0–8.3)

## 2013-10-20 LAB — H. PYLORI ANTIBODY, IGG: H Pylori IgG: NEGATIVE

## 2013-10-20 LAB — TSH: TSH: 1.5 u[IU]/mL (ref 0.35–4.50)

## 2013-10-20 NOTE — Assessment & Plan Note (Signed)
Diff Dx includesPUD, gallbladder, hypoglycemia, thyroid issue etc. Infeciton less likely.  Will eval with labs including cbc, tsh, hpylori, CMET.  If nml. consider US gallbladder versus referral to GI.

## 2013-10-20 NOTE — Addendum Note (Signed)
Addended by: Alvina ChouWALSH, TERRI J on: 10/20/2013 10:48 AM   Modules accepted: Orders

## 2013-10-20 NOTE — Progress Notes (Signed)
Pre visit review using our clinic review tool, if applicable. No additional management support is needed unless otherwise documented below in the visit note. 

## 2013-10-20 NOTE — Progress Notes (Signed)
   Subjective:    Patient ID: Alex Newman, male    DOB: 1970-10-30, 43 y.o.   MRN: [id number]  HPI  43 year old male pt of Dr. Cyndie Chimeopland's  with history of hypothyroid, GERD presents with intermittent nausea for the last year.  He states that he has episodes 2-3 days a week with nausea. Occurs when he wakes up. Improves after 2-3 hours. Usually better at lunch time. Associated with shakiness, sweating, heart racing. Weakness.  He throws up 2 times a month. No associated abdominal pain or headache or neuro changes.  Today he had a bad spell that got even worse as day went on.   Given  zofran to use as needed by PCP.  Saw PCP on 02/2013.  Mentioned HPylori  But I don't see that it was done.  Told to take prilosec but never started.   Recent TSH nml 08/24/2013 back  on armour thyroid  Per pt he has history of spastic colon and associated pain, but none in last 3 years.  Occ intermittant diarrhea.  Using tylenol sinus rarely, last time was 1 month ago. No new meds.    Review of Systems     Objective:   Physical Exam        Assessment & Plan:

## 2013-10-20 NOTE — Assessment & Plan Note (Signed)
EKG nml.

## 2013-10-20 NOTE — Patient Instructions (Signed)
Rest, fluids. USe zofran as needed. Start prilosec 20 mg daily over the counter. We will call with lab results.

## 2013-10-22 ENCOUNTER — Telehealth: Payer: Self-pay

## 2013-10-22 MED ORDER — PROMETHAZINE HCL 25 MG PO TABS: 12.5000 mg | ORAL_TABLET | Freq: Four times a day (QID) | ORAL | Status: DC | PRN

## 2013-10-22 NOTE — Telephone Encounter (Signed)
[name] notified prescription for phenergan [facility].  He would like to move forward with the ultrasound.  Will forward to Dr. Patsy Lageropland to order.

## 2013-10-22 NOTE — Telephone Encounter (Signed)
Order entered. Next planned step if US neg is GI referral.

## 2013-10-22 NOTE — Telephone Encounter (Signed)
Pt left v/m; pt was seen 10/20/13; zofran odt is not effective to help relieve nausea. Pt request different med that is stronger for nausea. [facility]. Pt request cb ASAP.

## 2013-10-22 NOTE — Telephone Encounter (Signed)
Change to:  Phenergan 25 mg. 1/2 - 1 tab po q 6 hours prn nausea. #30, 1 refill  My partner suggested a f/u ultrasound which would be reasonable. Labs normal.

## 2013-10-22 NOTE — Telephone Encounter (Signed)
i have not seen him in about a year.   I would rather have my partner who saw him a few days ago decide dispo. We can discuss if need be.

## 2013-10-22 NOTE — Addendum Note (Signed)
Addended by: Kerby NoraBEDSOLE, Araya Roel E on: 10/22/2013 12:32 PM   Modules accepted: Orders

## 2013-11-03 ENCOUNTER — Ambulatory Visit: Payer: Self-pay | Admitting: Family Medicine

## 2013-11-04 ENCOUNTER — Encounter: Payer: Self-pay | Admitting: Family Medicine

## 2013-11-04 ENCOUNTER — Telehealth: Payer: Self-pay | Admitting: Family Medicine

## 2013-11-04 DIAGNOSIS — R110 Nausea: Secondary | ICD-10-CM

## 2013-11-04 NOTE — Telephone Encounter (Signed)
For my partner, there is no gallbladder infection, no significant gallstones. Basically looks ok except for fatty changes in the liver that you normally see in heavier people. This should not be causing any pain.   I am going to involve my partner who recently evaluated him for this condition. Since I have not, I would really like her input.   Electronically Signed  By: Hannah Beat, MD On: 11/04/2013 5:03 PM

## 2013-11-04 NOTE — Telephone Encounter (Signed)
See below

## 2013-11-04 NOTE — Telephone Encounter (Signed)
Pt called to say he had [place] at [facility] yesterday and was told to call our office this morning to get results.  I informed him that we had not yet received results from [facility] and that we could call once they are sent to our office.

## 2013-11-04 NOTE — Telephone Encounter (Signed)
I will be on the lookout for it, and will cc: Dr. B who saw him.

## 2013-11-04 NOTE — Telephone Encounter (Signed)
Alex Newman notified of results given by Dr. Patsy Lager.   I advised we are still waiting on Dr. Daphine Deutscher input since she was the one that actually saw him and ordered the ultrasound,  but I feel she will recommend a referral to a GI specialist as next step.

## 2013-11-04 NOTE — Telephone Encounter (Signed)
Yes that is correct. I will put in GI referral.

## 2013-11-04 NOTE — Telephone Encounter (Signed)
Pt called again to get [place] results.  Results are scanned in chart for MD review.

## 2013-11-05 NOTE — Telephone Encounter (Signed)
Mr. Osborn notified that GI referral has been ordered by Dr. Ermalene Searing and to expect a call from [facility] or [place] with that appointment.

## 2013-11-26 ENCOUNTER — Ambulatory Visit: Payer: Self-pay | Admitting: Gastroenterology

## 2013-11-27 LAB — PATHOLOGY REPORT

## 2014-03-13 ENCOUNTER — Other Ambulatory Visit: Payer: Self-pay | Admitting: Family Medicine

## 2014-03-26 ENCOUNTER — Other Ambulatory Visit: Payer: Self-pay

## 2014-03-29 ENCOUNTER — Other Ambulatory Visit: Payer: Self-pay | Admitting: Family Medicine

## 2014-03-29 DIAGNOSIS — E038 Other specified hypothyroidism: Secondary | ICD-10-CM

## 2014-03-29 DIAGNOSIS — E039 Hypothyroidism, unspecified: Secondary | ICD-10-CM

## 2014-03-29 DIAGNOSIS — Z125 Encounter for screening for malignant neoplasm of prostate: Secondary | ICD-10-CM

## 2014-03-29 DIAGNOSIS — Z79899 Other long term (current) drug therapy: Secondary | ICD-10-CM

## 2014-03-29 DIAGNOSIS — E785 Hyperlipidemia, unspecified: Secondary | ICD-10-CM

## 2014-03-29 DIAGNOSIS — I519 Heart disease, unspecified: Secondary | ICD-10-CM

## 2014-03-30 ENCOUNTER — Telehealth: Payer: Self-pay | Admitting: *Deleted

## 2014-03-30 NOTE — Telephone Encounter (Signed)
Received fax from [employer] that states Prior Authorization for Ondansetron 8 mg dispersible tablets is about to expire.  Renewal PA completed on CoverMyMeds.  Approved through 04/01/2015.  Case ID: [id number].

## 2014-04-09 ENCOUNTER — Other Ambulatory Visit (INDEPENDENT_AMBULATORY_CARE_PROVIDER_SITE_OTHER): Payer: BLUE CROSS/BLUE SHIELD

## 2014-04-09 ENCOUNTER — Other Ambulatory Visit: Payer: Self-pay

## 2014-04-09 DIAGNOSIS — E785 Hyperlipidemia, unspecified: Secondary | ICD-10-CM

## 2014-04-09 DIAGNOSIS — Z125 Encounter for screening for malignant neoplasm of prostate: Secondary | ICD-10-CM

## 2014-04-09 DIAGNOSIS — Z79899 Other long term (current) drug therapy: Secondary | ICD-10-CM

## 2014-04-09 DIAGNOSIS — E038 Other specified hypothyroidism: Secondary | ICD-10-CM

## 2014-04-09 LAB — LIPID PANEL
Cholesterol: 194 mg/dL (ref 0–200)
HDL: 34.2 mg/dL — ABNORMAL LOW (ref >39.00)
LDL Cholesterol: 133 mg/dL — ABNORMAL HIGH (ref 0–99)
NonHDL: 159.8
Total CHOL/HDL Ratio: 6
Triglycerides: 132 mg/dL (ref 0.0–149.0)
VLDL: 26.4 mg/dL (ref 0.0–40.0)

## 2014-04-09 LAB — PSA: PSA: 0.6 ng/mL (ref 0.10–4.00)

## 2014-04-09 LAB — HEPATIC FUNCTION PANEL
ALT: 42 U/L (ref 0–53)
AST: 27 U/L (ref 0–37)
Albumin: 4.1 g/dL (ref 3.5–5.2)
Alkaline Phosphatase: 103 U/L (ref 39–117)
Bilirubin, Direct: 0.1 mg/dL (ref 0.0–0.3)
Total Bilirubin: 0.6 mg/dL (ref 0.2–1.2)
Total Protein: 7 g/dL (ref 6.0–8.3)

## 2014-04-09 LAB — CBC WITH DIFFERENTIAL/PLATELET
Basophils Absolute: 0 10*3/uL (ref 0.0–0.1)
Basophils Relative: 0.3 % (ref 0.0–3.0)
Eosinophils Absolute: 0.1 10*3/uL (ref 0.0–0.7)
Eosinophils Relative: 0.8 % (ref 0.0–5.0)
HCT: 46.9 % (ref 39.0–52.0)
Hemoglobin: 15.9 g/dL (ref 13.0–17.0)
Lymphocytes Relative: 31.4 % (ref 12.0–46.0)
Lymphs Abs: 2.5 10*3/uL (ref 0.7–4.0)
MCHC: 34 g/dL (ref 30.0–36.0)
MCV: 86.8 fl (ref 78.0–100.0)
Monocytes Absolute: 0.5 10*3/uL (ref 0.1–1.0)
Monocytes Relative: 6.1 % (ref 3.0–12.0)
Neutro Abs: 4.9 10*3/uL (ref 1.4–7.7)
Neutrophils Relative %: 61.4 % (ref 43.0–77.0)
Platelets: 205 10*3/uL (ref 150.0–400.0)
RBC: 5.41 Mil/uL (ref 4.22–5.81)
RDW: 14.2 % (ref 11.5–15.5)
WBC: 7.9 10*3/uL (ref 4.0–10.5)

## 2014-04-09 LAB — BASIC METABOLIC PANEL
BUN: 10 mg/dL (ref 6–23)
CO2: 28 mEq/L (ref 19–32)
Calcium: 9.4 mg/dL (ref 8.4–10.5)
Chloride: 106 mEq/L (ref 96–112)
Creatinine, Ser: 1.04 mg/dL (ref 0.40–1.50)
GFR: 82.1 mL/min (ref >60.00)
Glucose, Bld: 108 mg/dL — ABNORMAL HIGH (ref 70–99)
Potassium: 4.2 mEq/L (ref 3.5–5.1)
Sodium: 141 mEq/L (ref 135–145)

## 2014-04-09 LAB — T3, FREE: T3, Free: 4.1 pg/mL (ref 2.3–4.2)

## 2014-04-09 LAB — T4, FREE: Free T4: 0.72 ng/dL (ref 0.60–1.60)

## 2014-04-09 LAB — TSH: TSH: 1.81 u[IU]/mL (ref 0.35–4.50)

## 2014-04-09 NOTE — Addendum Note (Signed)
Addended by: Alvina ChouWALSH, Edgel Degnan J on: 04/09/2014 09:08 AM   Modules accepted: Orders

## 2014-06-07 ENCOUNTER — Encounter: Payer: Self-pay | Admitting: Family Medicine

## 2014-06-07 ENCOUNTER — Ambulatory Visit (INDEPENDENT_AMBULATORY_CARE_PROVIDER_SITE_OTHER): Payer: BLUE CROSS/BLUE SHIELD | Admitting: Family Medicine

## 2014-06-07 VITALS — BP 110/82 | HR 91 | Temp 97.6°F | Ht 71.0 in | Wt 332.5 lb

## 2014-06-07 DIAGNOSIS — R6883 Chills (without fever): Secondary | ICD-10-CM | POA: Diagnosis not present

## 2014-06-07 DIAGNOSIS — R6889 Other general symptoms and signs: Secondary | ICD-10-CM

## 2014-06-07 LAB — POCT INFLUENZA A/B
Influenza A, POC: NEGATIVE
Influenza B, POC: NEGATIVE

## 2014-06-07 MED ORDER — HYDROCODONE-HOMATROPINE 5-1.5 MG/5ML PO SYRP: ORAL_SOLUTION | ORAL | Status: DC

## 2014-06-07 NOTE — Progress Notes (Signed)
Pre visit review using our clinic review tool, if applicable. No additional management support is needed unless otherwise documented below in the visit note. 

## 2014-06-07 NOTE — Progress Notes (Signed)
   Dr. Karleen HampshireSpencer T. Armen Waring, MD, CAQ Sports Medicine Primary Care and Sports Medicine [address] [place], [id number] Phone: [phone] Fax: [phone]  06/07/2014  Patient: Alex Newman, MRN: [id number], DOB: 1970/09/08, 44 y.o.  Primary Physician:  Hannah BeatSpencer Chloe Miyoshi, MD  Chief Complaint: Nasal Congestion; Fatigue; Chills; and Sore Throat  Subjective:   Alex Newman presents with runny nose, sneezing, cough, sore throat, malaise, myalgias, arthralgia, chills, and fever. Feels terrible. Sinus pressure and runny nose and sore throat. Some nausea, had some last Tuesday. No real worse. Friday started to get bad. Cold chills. Really achy.   + recent exposure to others with similar symptoms.   The patent denies sore throat as the primary complaint. Denies sthortness of breath/wheezing, otalgia, facial pain, abdominal pain, changes in bowel or bladder.  Generally feels terrible  Tmax: ?  PMH, PHS, Allergies, Problem List, Medications, Family History, and Social History have all been reviewed.  ROS as above, eating and drinking - tolerating PO. Urinating normally. No excessive vomitting or diarrhea. O/w as above.  Objective:   Blood pressure 110/82, pulse 91, temperature 97.6 F (36.4 C), temperature source Oral, height 5\' 11"  (1.803 m), weight 332 lb 8 oz (150.821 kg).  Gen: WDWN, NAD; A & O x3, cooperative. Pleasant.Globally Non-toxic HEENT: Normocephalic and atraumatic. Throat clear, w/o exudate, R TM clear, L TM - good landmarks, No fluid present. rhinnorhea. No frontal or maxillary sinus T. MMM NECK: Anterior cervical  LAD is absent CV: RRR, No M/G/R, cap refill <2 sec PULM: Breathing comfortably in no respiratory distress. no wheezing, crackles, rhonchi ABD: S,NT,ND,+BS. No HSM. No rebound. EXT: No c/c/e PSYCH: Friendly, good eye contact MSK: Nml gait  Results for orders placed or performed in visit on 06/07/14  POCT Influenza A/B  Result Value Ref Range   Influenza A, POC Negative    Influenza B, POC Negative     Assessment and Plan:   Flu-like symptoms  Chills - Plan: POCT Influenza A/B   Supportive care, fluids, cough medicines as needed, and anti-pyretics. Infection control emphasized, including OOW or school until AF 24 hours.  Follow-up: No Follow-up on file.  New Prescriptions   HYDROCODONE-HOMATROPINE (HYCODAN) 5-1.5 MG/5ML SYRUP    1 tsp po at night before bed prn cough   Orders Placed This Encounter  Procedures  . POCT Influenza A/B    Signed,  Marylon Verno T. Claritza July, MD   Patient's Medications  New Prescriptions   HYDROCODONE-HOMATROPINE (HYCODAN) 5-1.5 MG/5ML SYRUP    1 tsp po at night before bed prn cough  Previous Medications   ARMOUR THYROID 15 MG TABLET    TAKE THREE TABLETS BY MOUTH DAILY   ONDANSETRON (ZOFRAN-ODT) 8 MG DISINTEGRATING TABLET    TAKE 1 TABLET BY MOUTH EVERY 8 HOURS AS NEEDED FOR NAUSEA OR VOMITING   PROMETHAZINE (PHENERGAN) 25 MG TABLET    Take 0.5-1 tablets (12.5-25 mg total) by mouth every 6 (six) hours as needed for nausea or vomiting.   PSEUDOEPHEDRINE-ACETAMINOPHEN (TYLENOL SINUS) 30-500 MG TABS    Take 2 tablets by mouth as needed.  Modified Medications   No medications on file  Discontinued Medications   No medications on file

## 2014-08-30 ENCOUNTER — Telehealth: Payer: Self-pay | Admitting: Family Medicine

## 2014-08-30 NOTE — Telephone Encounter (Signed)
Pt called stating he is still having  Nausea every couple weeks  He wanted to know what the next step is.

## 2014-08-30 NOTE — Telephone Encounter (Signed)
If agreeable, i would consult GI

## 2014-08-30 NOTE — Telephone Encounter (Signed)
Mr. Kottman notified by telephone that Dr. Patsy Lager would recommend a consult with GI.  Patient would like to know if the nausea could be a side effect from the Armour Thyroid.  Please advise.  Patient states a better contact number for him is his cell phone [phone].

## 2014-08-30 NOTE — Telephone Encounter (Signed)
Left message for Alex Newman to return my call.  ?

## 2014-08-31 NOTE — Telephone Encounter (Signed)
[name] notified as instructed by telephone.  He is agreeable with the referral to GI.  Will forward to Dr. Patsy Lageropland to order referral.

## 2014-08-31 NOTE — Telephone Encounter (Signed)
Left message for Alex Newman to return my call.

## 2014-08-31 NOTE — Telephone Encounter (Signed)
No.  I have never heard of this as a side effect, and I looked it up, too. Not listed as a common or rare side effect.   Still rec GI consultation.

## 2014-09-01 ENCOUNTER — Other Ambulatory Visit: Payer: Self-pay | Admitting: Family Medicine

## 2014-09-01 DIAGNOSIS — R1084 Generalized abdominal pain: Secondary | ICD-10-CM

## 2014-09-01 DIAGNOSIS — R110 Nausea: Secondary | ICD-10-CM

## 2014-09-13 ENCOUNTER — Encounter: Payer: Self-pay | Admitting: Gastroenterology

## 2014-09-13 ENCOUNTER — Other Ambulatory Visit: Payer: Self-pay | Admitting: Family Medicine

## 2014-09-13 ENCOUNTER — Ambulatory Visit (INDEPENDENT_AMBULATORY_CARE_PROVIDER_SITE_OTHER): Payer: BLUE CROSS/BLUE SHIELD | Admitting: Gastroenterology

## 2014-09-13 VITALS — BP 118/70 | HR 72 | Ht 70.5 in | Wt 339.2 lb

## 2014-09-13 DIAGNOSIS — K760 Fatty (change of) liver, not elsewhere classified: Secondary | ICD-10-CM

## 2014-09-13 DIAGNOSIS — R112 Nausea with vomiting, unspecified: Secondary | ICD-10-CM

## 2014-09-13 MED ORDER — ESOMEPRAZOLE MAGNESIUM 40 MG PO CPDR: 40.0000 mg | DELAYED_RELEASE_CAPSULE | Freq: Every morning | ORAL | Status: DC

## 2014-09-13 MED ORDER — ESOMEPRAZOLE MAGNESIUM 40 MG PO CPDR: 40.0000 mg | DELAYED_RELEASE_CAPSULE | Freq: Two times a day (BID) | ORAL | Status: DC

## 2014-09-13 NOTE — Telephone Encounter (Signed)
Last office visit 06/07/2014.  Last refilled 10/12/2013 for #30 with 1 refill.  Ok to refill?

## 2014-09-13 NOTE — Patient Instructions (Addendum)
We have sent the following medications to your pharmacy for you to pick up at your convenience:Nexium.  Patient advised to avoid spicy, acidic, citrus, chocolate, mints, fruit and fruit juices.  Limit the intake of caffeine, alcohol and Soda.  Don't exercise too soon after eating.  Don't lie down within 3-4 hours of eating.  Elevate the head of your bed.  Your follow up appointment with Dr. Russella DarStark is on 11/16/14 at 9:00am. If you need to reschedule or cancel please call our office at [phone].  Thank you for choosing me and [facility].  Venita LickMalcolm T. Pleas KochStark, Jr., MD., Clementeen GrahamFACG

## 2014-09-13 NOTE — Progress Notes (Signed)
    History of Present Illness: This is a 44 year old male referred by Hannah Beatopland, Spencer, MD for the evaluation of nausea and infrequent vomiting. He is accompanied by his wife. He was evaluated by Dr. Midge Miniumarren Wohl in 2015 for complaints of nausea for 1 year. Abd US in September 2015 showed gallbladder sludge and fatty liver. He underwent upper endoscopy in Sept 2015 by Dr. Servando SnareWohl which showed a mild mucosal texture abnormality in the upper esophagus and mild antral erythema. Biopsies showed gastric type mucosa with mild chronic inflammation in the esophagus and chronic gastritis suspicious for reactive gastropathy in the antrum. He was treated for gastritis and GERD with dexlansoprazole. Blood work in February 2016 showed a normal CBC, normal LFTs and a normal TSH. He notes nausea for about a year and a half but occurs every 2-3 weeks generally bothersome in the early morning when he wakes up and resolves by 10 or 11 AM. About every 2 months he will have an episode of vomiting. He has not been treated with antireflux medications since he took samples of dexlansoprazole last fall. He states certain foods lead to diarrhea like pizza so he tends to minimize their use. He cannot associate any particular foods with his nausea. Denies weight loss, abdominal pain, constipation, change in stool caliber, melena, hematochezia, dysphagia, reflux symptoms, chest pain.   Review of Systems: Pertinent positive and negative review of systems were noted in the above HPI section. All other review of systems were otherwise negative.  Current Medications, Allergies, Past Medical History, Past Surgical History, Family History and Social History were reviewed in [facility] electronic medical record.  Physical Exam: General: Well developed, well nourished, obese, no acute distress Head: Normocephalic and atraumatic Eyes:  sclerae anicteric, EOMI Ears: Normal auditory acuity Mouth: No deformity or lesions Neck: Supple, no  masses or thyromegaly Lungs: Clear throughout to auscultation Heart: Regular rate and rhythm; no murmurs, rubs or bruits Abdomen: Soft, non tender and non distended. No masses, hepatosplenomegaly or hernias noted. Normal Bowel sounds Musculoskeletal: Symmetrical with no gross deformities  Skin: No lesions on visible extremities Pulses:  Normal pulses noted Extremities: No clubbing, cyanosis, edema or deformities noted Neurological: Alert oriented x 4, grossly nonfocal Cervical Nodes:  No significant cervical adenopathy Inguinal Nodes: No significant inguinal adenopathy Psychological:  Alert and cooperative. Normal mood and affect  Assessment and Recommendations:  1. Intermittent nausea and vomiting. Likely related to GERD and gastritis. Begin standard antireflux measures. Avoid aspirin and NSAIDs. Begin Nexium 40 mg twice daily for 3 months and assess response. REV in 2 months.   2. Hepatic steatosis. Recommended long-term weight loss program with a low-fat diet targeting to achieve a normal BMI supervised by his PCP. Optimal management of elevated lipids per his PCP.   cc: Hannah BeatSpencer Copland, MD [address] [place] [address] [place], [place] [address]

## 2014-10-16 ENCOUNTER — Other Ambulatory Visit: Payer: Self-pay | Admitting: Family Medicine

## 2014-11-09 NOTE — Telephone Encounter (Signed)
Pt wanted to make sure armour thyroid has refills available; spoke with Pam at [facility] and refills are available. Pt voiced understanding.

## 2014-11-16 ENCOUNTER — Encounter: Payer: Self-pay | Admitting: Gastroenterology

## 2014-11-16 ENCOUNTER — Ambulatory Visit (INDEPENDENT_AMBULATORY_CARE_PROVIDER_SITE_OTHER): Payer: BLUE CROSS/BLUE SHIELD | Admitting: Gastroenterology

## 2014-11-16 VITALS — BP 122/80 | HR 84 | Ht 70.5 in | Wt 340.4 lb

## 2014-11-16 DIAGNOSIS — K219 Gastro-esophageal reflux disease without esophagitis: Secondary | ICD-10-CM

## 2014-11-16 DIAGNOSIS — R194 Change in bowel habit: Secondary | ICD-10-CM | POA: Diagnosis not present

## 2014-11-16 DIAGNOSIS — R110 Nausea: Secondary | ICD-10-CM

## 2014-11-16 MED ORDER — PANTOPRAZOLE SODIUM 40 MG PO TBEC: 40.0000 mg | DELAYED_RELEASE_TABLET | Freq: Two times a day (BID) | ORAL | Status: DC

## 2014-11-16 NOTE — Progress Notes (Signed)
    History of Present Illness: This is a 44 year old white male returning for follow-up of nausea. Complains of change in bowel habits. He is accompanied by his wife. He states his nausea improved substantially with taking Nexium twice daily however he noted a change in bowel habits with more frequent alternating diarrhea and constipation. He has had alternating bowel habits for many years but this appeared to worsen while on Nexium. Denies weight loss, change in appetite, hematochezia and melena.  Current Medications, Allergies, Past Medical History, Past Surgical History, Family History and Social History were reviewed in [employer] record.  Physical Exam: General: Well developed, obese, no acute distress Head: Normocephalic and atraumatic Eyes:  sclerae anicteric, EOMI Ears: Normal auditory acuity Mouth: No deformity or lesions Lungs: Clear throughout to auscultation Heart: Regular rate and rhythm; no murmurs, rubs or bruits Abdomen: Soft, non tender and non distended. No masses, hepatosplenomegaly or hernias noted. Normal Bowel sounds Musculoskeletal: Symmetrical with no gross deformities  Pulses:  Normal pulses noted Extremities: No clubbing, cyanosis, edema or deformities noted Neurological: Alert oriented x 4, grossly nonfocal Psychological:  Alert and cooperative. Depressed affect  Assessment and Recommendations:  1. GERD associated with nausea and intermittent vomiting. His nausea and vomiting improved substantially on Nexium twice daily however with his bowel habit change she is concerned that Nexium may have caused the side effect. Changed to pantoprazole 40 mg twice daily. One component of his nausea was not relieved with treatment of GERD and therefore other factors contributing to nausea should be considered by his PCP. I have not determined any other GI causes of nausea.  2. Change in bowel habits with worsening in chronic pattern of alternating  diarrhea and constipation. Advised and home stool Hemoccults for further evaluation and he declines at this time stating wants to try pantoprazole in place of Nexium. If his symptoms do not improve he will reconsider colonoscopy and stool Hemoccults. CMP, CBC, TSH, PSA were normal in 04/2014. Advised high-fiber diet with adequate daily water intake and MiraLAX daily as needed for constipation.

## 2014-11-16 NOTE — Patient Instructions (Signed)
We have sent the following medications to your pharmacy for you to pick up at your convenience:pantoprazole.  You can take over the counter Miralax mixing 17 grams in 8 oz of water daily for constipation.  High-Fiber Diet Fiber is found in fruits, vegetables, and grains. A high-fiber diet encourages the addition of more whole grains, legumes, fruits, and vegetables in your diet. The recommended amount of fiber for adult males is 38 g per day. For adult females, it is 25 g per day. Pregnant and lactating women should get 28 g of fiber per day. If you have a digestive or bowel problem, ask your caregiver for advice before adding high-fiber foods to your diet. Eat a variety of high-fiber foods instead of only a select few type of foods.  PURPOSE  To increase stool bulk.  To make bowel movements more regular to prevent constipation.  To lower cholesterol.  To prevent overeating. WHEN IS THIS DIET USED?  It may be used if you have constipation and hemorrhoids.  It may be used if you have uncomplicated diverticulosis (intestine condition) and irritable bowel syndrome.  It may be used if you need help with weight management.  It may be used if you want to add it to your diet as a protective measure against atherosclerosis, diabetes, and cancer. SOURCES OF FIBER  Whole-grain breads and cereals.  Fruits, such as apples, oranges, bananas, berries, prunes, and pears.  Vegetables, such as green peas, carrots, sweet potatoes, beets, broccoli, cabbage, spinach, and artichokes.  Legumes, such split peas, soy, lentils.  Almonds. FIBER CONTENT IN FOODS Starches and Grains / Dietary Fiber (g)  Cheerios, 1 cup / 3 g  Corn Flakes cereal, 1 cup / 0.7 g  Rice crispy treat cereal, 1 cup / 0.3 g  Instant oatmeal (cooked),  cup / 2 g  Frosted wheat cereal, 1 cup / 5.1 g  Brown, long-grain rice (cooked), 1 cup / 3.5 g  White, long-grain rice (cooked), 1 cup / 0.6 g  Enriched macaroni  (cooked), 1 cup / 2.5 g Legumes / Dietary Fiber (g)  Baked beans (canned, plain, or vegetarian),  cup / 5.2 g  Kidney beans (canned),  cup / 6.8 g  Pinto beans (cooked),  cup / 5.5 g Breads and Crackers / Dietary Fiber (g)  Plain or honey graham crackers, 2 squares / 0.7 g  Saltine crackers, 3 squares / 0.3 g  Plain, salted pretzels, 10 pieces / 1.8 g  Whole-wheat bread, 1 slice / 1.9 g  White bread, 1 slice / 0.7 g  Raisin bread, 1 slice / 1.2 g  Plain bagel, 3 oz / 2 g  Flour tortilla, 1 oz / 0.9 g  Corn tortilla, 1 small / 1.5 g  Hamburger or hotdog bun, 1 small / 0.9 g Fruits / Dietary Fiber (g)  Apple with skin, 1 medium / 4.4 g  Sweetened applesauce,  cup / 1.5 g  Banana,  medium / 1.5 g  Grapes, 10 grapes / 0.4 g  Orange, 1 small / 2.3 g  Raisin, 1.5 oz / 1.6 g  Melon, 1 cup / 1.4 g Vegetables / Dietary Fiber (g)  Green beans (canned),  cup / 1.3 g  Carrots (cooked),  cup / 2.3 g  Broccoli (cooked),  cup / 2.8 g  Peas (cooked),  cup / 4.4 g  Mashed potatoes,  cup / 1.6 g  Lettuce, 1 cup / 0.5 g  Corn (canned),  cup / 1.6 g  Tomato,  cup / 1.1 g Document Released: 02/19/2005 Document Revised: 08/21/2011 Document Reviewed: 05/24/2011 [facility] Patient Information 2015 [place], [place]. This information is not intended to replace advice given to you by your health care provider. Make sure you discuss any questions you have with your health care provider.     Thank you for choosing me and [facility].  Venita Lick. Pleas Koch., MD., Clementeen Graham

## 2015-01-10 ENCOUNTER — Emergency Department: Payer: BLUE CROSS/BLUE SHIELD

## 2015-01-10 ENCOUNTER — Encounter: Payer: Self-pay | Admitting: Emergency Medicine

## 2015-01-10 ENCOUNTER — Emergency Department
Admission: EM | Admit: 2015-01-10 | Discharge: 2015-01-10 | Disposition: A | Discharge status: Home / Self Care | Payer: BLUE CROSS/BLUE SHIELD | Attending: Emergency Medicine | Admitting: Emergency Medicine

## 2015-01-10 DIAGNOSIS — N23 Unspecified renal colic: Secondary | ICD-10-CM | POA: Insufficient documentation

## 2015-01-10 DIAGNOSIS — Z880 Allergy status to penicillin: Secondary | ICD-10-CM | POA: Insufficient documentation

## 2015-01-10 DIAGNOSIS — R109 Unspecified abdominal pain: Secondary | ICD-10-CM | POA: Diagnosis present

## 2015-01-10 LAB — CBC WITH DIFFERENTIAL/PLATELET
Basophils Absolute: 0 10*3/uL (ref 0–0.1)
Basophils Relative: 1 %
Eosinophils Absolute: 0 10*3/uL (ref 0–0.7)
Eosinophils Relative: 0 %
HCT: 47.5 % (ref 40.0–52.0)
Hemoglobin: 16 g/dL (ref 13.0–18.0)
Lymphocytes Relative: 21 %
Lymphs Abs: 1.7 10*3/uL (ref 1.0–3.6)
MCH: 29.5 pg (ref 26.0–34.0)
MCHC: 33.7 g/dL (ref 32.0–36.0)
MCV: 87.6 fL (ref 80.0–100.0)
Monocytes Absolute: 0.4 10*3/uL (ref 0.2–1.0)
Monocytes Relative: 4 %
Neutro Abs: 6.2 10*3/uL (ref 1.4–6.5)
Neutrophils Relative %: 74 %
Platelets: 185 10*3/uL (ref 150–440)
RBC: 5.42 MIL/uL (ref 4.40–5.90)
RDW: 14.1 % (ref 11.5–14.5)
WBC: 8.3 10*3/uL (ref 3.8–10.6)

## 2015-01-10 LAB — URINALYSIS COMPLETE WITH MICROSCOPIC (ARMC ONLY)
Bacteria, UA: NONE SEEN
Bilirubin Urine: NEGATIVE
Glucose, UA: NEGATIVE mg/dL
Ketones, ur: NEGATIVE mg/dL
Leukocytes, UA: NEGATIVE
Nitrite: NEGATIVE
Protein, ur: NEGATIVE mg/dL
Specific Gravity, Urine: 1.013 (ref 1.005–1.030)
pH: 5 (ref 5.0–8.0)

## 2015-01-10 LAB — COMPREHENSIVE METABOLIC PANEL
ALT: 49 U/L (ref 17–63)
AST: 40 U/L (ref 15–41)
Albumin: 3.9 g/dL (ref 3.5–5.0)
Alkaline Phosphatase: 103 U/L (ref 38–126)
Anion gap: 8 (ref 5–15)
BUN: 8 mg/dL (ref 6–20)
CO2: 25 mmol/L (ref 22–32)
Calcium: 9.1 mg/dL (ref 8.9–10.3)
Chloride: 108 mmol/L (ref 101–111)
Creatinine, Ser: 0.95 mg/dL (ref 0.61–1.24)
GFR calc Af Amer: >60 mL/min (ref >60)
GFR calc non Af Amer: >60 mL/min (ref >60)
Glucose, Bld: 122 mg/dL — ABNORMAL HIGH (ref 65–99)
Potassium: 4 mmol/L (ref 3.5–5.1)
Sodium: 141 mmol/L (ref 135–145)
Total Bilirubin: 0.8 mg/dL (ref 0.3–1.2)
Total Protein: 7.7 g/dL (ref 6.5–8.1)

## 2015-01-10 MED ORDER — KETOROLAC TROMETHAMINE 30 MG/ML IJ SOLN
30.0000 mg | Freq: Once | INTRAMUSCULAR | Status: AC
  Administered 2015-01-10: 30 mg via INTRAVENOUS
  Filled 2015-01-10: qty 1

## 2015-01-10 MED ORDER — TAMSULOSIN HCL 0.4 MG PO CAPS: 0.4000 mg | ORAL_CAPSULE | Freq: Every day | ORAL | Status: DC

## 2015-01-10 MED ORDER — ONDANSETRON HCL 4 MG/2ML IJ SOLN
INTRAMUSCULAR | Status: AC
  Administered 2015-01-10: 4 mg via INTRAVENOUS
  Filled 2015-01-10: qty 2

## 2015-01-10 MED ORDER — ONDANSETRON HCL 4 MG/2ML IJ SOLN: 4.0000 mg | Freq: Once | INTRAMUSCULAR | Status: DC

## 2015-01-10 MED ORDER — OXYCODONE-ACETAMINOPHEN 5-325 MG PO TABS
2.0000 | ORAL_TABLET | Freq: Once | ORAL | Status: AC
  Administered 2015-01-10: 2 via ORAL
  Filled 2015-01-10: qty 2

## 2015-01-10 MED ORDER — KETOROLAC TROMETHAMINE 10 MG PO TABS: 10.0000 mg | ORAL_TABLET | Freq: Four times a day (QID) | ORAL | Status: DC | PRN

## 2015-01-10 MED ORDER — HYDROMORPHONE HCL 1 MG/ML IJ SOLN
1.0000 mg | Freq: Once | INTRAMUSCULAR | Status: AC
  Administered 2015-01-10: 1 mg via INTRAVENOUS
  Filled 2015-01-10: qty 1

## 2015-01-10 MED ORDER — OXYCODONE-ACETAMINOPHEN 7.5-325 MG PO TABS: 1.0000 | ORAL_TABLET | ORAL | Status: DC | PRN

## 2015-01-10 MED ORDER — ONDANSETRON HCL 4 MG/2ML IJ SOLN
4.0000 mg | Freq: Once | INTRAMUSCULAR | Status: AC
  Administered 2015-01-10: 4 mg via INTRAVENOUS

## 2015-01-10 MED ORDER — SODIUM CHLORIDE 0.9 % IV SOLN
Freq: Once | INTRAVENOUS | Status: AC
  Administered 2015-01-10: 09:00:00 via INTRAVENOUS

## 2015-01-10 NOTE — Discharge Instructions (Signed)
Kidney Stones °Kidney stones (urolithiasis) are deposits that form inside your kidneys. The intense pain is caused by the stone moving through the urinary tract. When the stone moves, the ureter goes into spasm around the stone. The stone is usually passed in the urine.  °CAUSES  °· A disorder that makes certain neck glands produce too much parathyroid hormone (primary hyperparathyroidism). °· A buildup of uric acid crystals, similar to gout in your joints. °· Narrowing (stricture) of the ureter. °· A kidney obstruction present at birth (congenital obstruction). °· Previous surgery on the kidney or ureters. °· Numerous kidney infections. °SYMPTOMS  °· Feeling sick to your stomach (nauseous). °· Throwing up (vomiting). °· Blood in the urine (hematuria). °· Pain that usually spreads (radiates) to the groin. °· Frequency or urgency of urination. °DIAGNOSIS  °· Taking a history and physical exam. °· Blood or urine tests. °· CT scan. °· Occasionally, an examination of the inside of the urinary bladder (cystoscopy) is performed. °TREATMENT  °· Observation. °· Increasing your fluid intake. °· Extracorporeal shock wave lithotripsy--This is a noninvasive procedure that uses shock waves to break up kidney stones. °· Surgery may be needed if you have severe pain or persistent obstruction. There are various surgical procedures. Most of the procedures are performed with the use of small instruments. Only small incisions are needed to accommodate these instruments, so recovery time is minimized. °The size, location, and chemical composition are all important variables that will determine the proper choice of action for you. Talk to your health care provider to better understand your situation so that you will minimize the risk of injury to yourself and your kidney.  °HOME CARE INSTRUCTIONS  °· Drink enough water and fluids to keep your urine clear or pale yellow. This will help you to pass the stone or stone fragments. °· Strain  all urine through the provided strainer. Keep all particulate matter and stones for your health care provider to see. The stone causing the pain may be as small as a grain of salt. It is very important to use the strainer each and every time you pass your urine. The collection of your stone will allow your health care provider to analyze it and verify that a stone has actually passed. The stone analysis will often identify what you can do to reduce the incidence of recurrences. °· Only take over-the-counter or prescription medicines for pain, discomfort, or fever as directed by your health care provider. °· Keep all follow-up visits as told by your health care provider. This is important. °· Get follow-up X-rays if required. The absence of pain does not always mean that the stone has passed. It may have only stopped moving. If the urine remains completely obstructed, it can cause loss of kidney function or even complete destruction of the kidney. It is your responsibility to make sure X-rays and follow-ups are completed. Ultrasounds of the kidney can show blockages and the status of the kidney. Ultrasounds are not associated with any radiation and can be performed easily in a matter of minutes. °· Make changes to your daily diet as told by your health care provider. You may be told to: °¨ Limit the amount of salt that you eat. °¨ Eat 5 or more servings of fruits and vegetables each day. °¨ Limit the amount of meat, poultry, fish, and eggs that you eat. °· Collect a 24-hour urine sample as told by your health care provider. You may need to collect another urine sample every 6-12   months. °SEEK MEDICAL CARE IF: °· You experience pain that is progressive and unresponsive to any pain medicine you have been prescribed. °SEEK IMMEDIATE MEDICAL CARE IF:  °· Pain cannot be controlled with the prescribed medicine. °· You have a fever or shaking chills. °· The severity or intensity of pain increases over 18 hours and is not  relieved by pain medicine. °· You develop a new onset of abdominal pain. °· You feel faint or pass out. °· You are unable to urinate. °  °This information is not intended to replace advice given to you by your health care provider. Make sure you discuss any questions you have with your health care provider. °  °Document Released: 02/19/2005 Document Revised: 11/10/2014 Document Reviewed: 07/23/2012 °Elsevier Interactive Patient Education ©2016 Elsevier Inc. ° °

## 2015-01-10 NOTE — ED Provider Notes (Signed)
[facility] Emergency Department Provider Note     Time seen: ----------------------------------------- 8:32 AM on 01/10/2015 -----------------------------------------    I have reviewed the triage vital signs and the nursing notes.   HISTORY  Chief Complaint Flank Pain    HPI Alex Newman is a 44 y.o. male who presents ER for right flank pain since yesterday. Patient states unable to void this morning. He has a history of kidney stones, states this feels like his 13th kidney stone. He has had a history of lithotripsy in the past for very large stones. He denies any fevers chills or other complaints this time other than right flank pain that is dull.   Past Medical History  Diagnosis Date  . Allergy   . Thyroid disease   . Hyperlipidemia   . Nephrolithiasis   . Hypogonadism male 11/19/2011  . GERD (gastroesophageal reflux disease) 02/11/2013    Patient Active Problem List   Diagnosis Date Noted  . Palpitations 10/20/2013  . GERD (gastroesophageal reflux disease) 02/11/2013  . Hypogonadism male 11/19/2011  . MORBID OBESITY 05/18/2010  . BACK PAIN, LUMBAR 09/29/2007  . HYPERCHOLESTEROLEMIA 07/10/2007  . ALLERGIC RHINITIS 07/10/2007  . RENAL CALCULUS 07/10/2007  . HYPOTHYROIDISM 07/10/2007  . FATIGUE 06/19/2007    Past Surgical History  Procedure Laterality Date  . Ganglion cyst excision Right     wrist x 2  . Kidney stone removal      x2    Allergies Cetirizine hcl; Penicillins; and Propoxyphene n-acetaminophen  Social History Social History  Substance Use Topics  . Smoking status: Never Smoker   . Smokeless tobacco: Never Used  . Alcohol Use: No    Review of Systems Constitutional: Negative for fever. Eyes: Negative for visual changes. ENT: Negative for sore throat. Cardiovascular: Negative for chest pain. Respiratory: Negative for shortness of breath. Gastrointestinal: Positive right flank pain, negative for  vomiting Genitourinary: Negative for dysuria. Musculoskeletal: Negative for back pain. Skin: Negative for rash. Neurological: Negative for headaches, focal weakness or numbness.  10-point ROS otherwise negative.  ____________________________________________   PHYSICAL EXAM:  VITAL SIGNS: ED Triage Vitals  Enc Vitals Group     BP 01/10/15 0812 134/87 mmHg     Pulse Rate 01/10/15 0812 96     Resp 01/10/15 0812 20     Temp 01/10/15 0812 98.1 F (36.7 C)     Temp Source 01/10/15 0812 Oral     SpO2 01/10/15 0812 96 %     Weight 01/10/15 0812 330 lb (149.687 kg)     Height 01/10/15 0812  (1.803 m)     Head Cir --      Peak Flow --      Pain Score 01/10/15 0813 3     Pain Loc --      Pain Edu? --      Excl. in GC? --     Constitutional: Alert and oriented. Well appearing and in no distress. Eyes: Conjunctivae are normal. PERRL. Normal extraocular movements. ENT   Head: Normocephalic and atraumatic.   Nose: No congestion/rhinnorhea.   Mouth/Throat: Mucous membranes are moist.   Neck: No stridor. Cardiovascular: Normal rate, regular rhythm. Normal and symmetric distal pulses are present in all extremities. No murmurs, rubs, or gallops. Respiratory: Normal respiratory effort without tachypnea nor retractions. Breath sounds are clear and equal bilaterally. No wheezes/rales/rhonchi. Gastrointestinal: Mild right flank tenderness, no rebound or guarding. Normal bowel sounds. Musculoskeletal: Nontender with normal range of motion in all extremities. No  joint effusions.  No lower extremity tenderness nor edema. Neurologic:  Normal speech and language. No gross focal neurologic deficits are appreciated. Speech is normal. No gait instability. Skin:  Skin is warm, dry and intact. No rash noted. Psychiatric: Mood and affect are normal. Speech and behavior are normal. Patient exhibits appropriate insight and judgment. ____________________________________________  ED  COURSE:  Pertinent labs & imaging results that were available during my care of the patient were reviewed by me and considered in my medical decision making (see chart for details). We'll check basic labs, patient receive IV Dilaudid and reevaluation. ____________________________________________    LABS (pertinent positives/negatives)  Labs Reviewed  COMPREHENSIVE METABOLIC PANEL - Abnormal; Notable for the following:    Glucose, Bld 122 (*)    All other components within normal limits  URINALYSIS COMPLETEWITH MICROSCOPIC ([facility] ONLY) - Abnormal; Notable for the following:    Color, Urine YELLOW (*)    APPearance HAZY (*)    Hgb urine dipstick 3+ (*)    Squamous Epithelial / LPF 0-5 (*)    All other components within normal limits  CBC WITH DIFFERENTIAL/PLATELET    RADIOLOGY Images were viewed by me  KUB IMPRESSION: No definite nephrolithiasis but suspicion of 3-4 mm right ureteral calculus at the L4 level. Noncontrast CT Abdomen and Pelvis would best evaluate further. ____________________________________________  FINAL ASSESSMENT AND PLAN  Renal colic  Plan: Patient with labs and imaging as dictated above. Patient was feeling better after IV pain medicine and fluids. He'll be discharged with pain medicine and Flomax and referred to local urology for follow-up.   Emily FilbertWilliams, Jonathan E, MD   Emily FilbertJonathan E Williams, MD 01/11/15 [phone]

## 2015-01-10 NOTE — ED Notes (Signed)
Pt to ed with c/o right flank pain since yesterday, reports unable to void this am.

## 2015-03-23 ENCOUNTER — Telehealth: Payer: Self-pay | Admitting: Family Medicine

## 2015-03-23 NOTE — Telephone Encounter (Signed)
PLEASE NOTE: All timestamps contained within this report are represented as [place] Standard Time. CONFIDENTIALTY NOTICE: This fax transmission is intended only for the addressee. It contains information that is legally privileged, confidential or otherwise protected from use or disclosure. If you are not the intended recipient, you are strictly prohibited from reviewing, disclosing, copying using or disseminating any of this information or taking any action in reliance on or regarding this information. If you have received this fax in error, please notify us immediately by telephone so that we can arrange for its return to [place]. Phone: [phone], Toll-Free: [phone], Fax: [phone] Page: 1 of 2 Call Id: [id number] [facility] Day - Client TELEPHONE ADVICE RECORD [facility] Patient Name: Alex Newman Gender: Male DOB: 03/17/1970 Age: 45 Y 10 M 27 D Return Phone Number: [phone] (Primary) Address: City/State/Zip:  Client [facility] Day - Client Client Site [facility] - Day Physician Copland, Spencer Contact Type Call Call Type Triage / Clinical Caller Name Mia Relationship To Patient Self Appointment Disposition EMR Appointment Scheduled Info pasted into Epic Yes Return Phone Number [phone] (Primary) Chief Complaint Blood In Stool Initial Comment Caller states he has blood in his stool. PreDisposition Did not know what to do Nurse Assessment Nurse: Vickey Sages, RN, Jacquilin Date/Time (Eastern Time): 03/23/2015 8:29:08 AM Confirm and document reason for call. If symptomatic, describe symptoms. You must click the next button to save text entered. ---Caller states he has blood in his stool. Has the patient traveled out of the country within the last 30 days? ---No Does the patient have any new or worsening symptoms? ---Yes Will a triage be completed? ---Yes Related visit to  physician within the last 2 weeks? ---No Does the PT have any chronic conditions? (i.e. diabetes, asthma, etc.) ---Yes List chronic conditions. ---hypothyroid Is this a behavioral health or substance abuse call? ---No Guidelines Guideline Title Affirmed Question Affirmed Notes Nurse Date/Time Lamount Cohen Time) Rectal Bleeding Rectal bleeding is minimal (e.g., blood just on toilet paper, a few drops in toilet bowl) (all triage questions negative) Atkins, RN, Jacquilin 03/23/2015 8:29:23 AM Disp. Time Lamount Cohen Time) Disposition Final User 03/23/2015 8:16:20 AM Attempt made - message left Vickey Sages, RN, Jacquilin 03/23/2015 8:33:44 AM Home Care Yes Vickey Sages, RN, Jacquilin PLEASE NOTE: All timestamps contained within this report are represented as [place] Standard Time. CONFIDENTIALTY NOTICE: This fax transmission is intended only for the addressee. It contains information that is legally privileged, confidential or otherwise protected from use or disclosure. If you are not the intended recipient, you are strictly prohibited from reviewing, disclosing, copying using or disseminating any of this information or taking any action in reliance on or regarding this information. If you have received this fax in error, please notify us immediately by telephone so that we can arrange for its return to [place]. Phone: [phone], Toll-Free: [phone], Fax: [phone] Page: 2 of 2 Call Id: [id number] Caller Understands: Yes Disagree/Comply: Comply Care Advice Given Per Guideline HOME CARE: You should be able to treat this at home. REASSURANCE: You have told me that there is only mild bleeding. Often this can be caused by either hemorrhoids or a small tear (fissure) in the skin of the anus (rectal opening). There are several things that you can do to make this better. WARM SALINE SITZ BATHS TWICE DAILY FOR RECTAL SYMPTOMS: * Sit in a warm sitz bath for 20 minutes twice a day. This will decrease swelling and  irritation, keep  the area clean, and help with healing. * Afterwards, pat area dry with unscented toilet paper. HOW TO MAKE A SALINE (SALT WATER) SITZ BATH: * Here is how you can make a saline sitz bath. * Fill the tub with warm water until it is 3-4 inches (7-10 cm) deep. * Add 1/4 cup (80 g) of table salt or baking soda to a tub of warm water. Stir the water until it dissolves. TO SOFTEN STOOLS AND TREAT CONSTIPATION: * Eat a high fiber diet. * Drink adequate liquids (6-8 glasses of water a day) * Exercise regularly (even a daily 15 minute walk!) * Get into a rhythm - try to have a BM at the same time each day. * Don't ignore your body's signals regarding having a BM * Avoid enemas and stimulant laxatives. HIGH FIBER DIET- A high fiber diet will help improve your intestinal function and soften your BM's. The fiber works by holding more water in your stools: HYDROCORTISONE OINTMENT FOR RECTAL ITCHING: * After SITZ bath and drying your rectal area, apply 1% hydrocortisone ointment (OTC) 2 times a day to reduce irritation. * [place] - Found in various OTC hemorrhoid medications (e.g., Anusol HC, Preparation H Hydrocortisone, Analpram HC Cream) CALL BACK IF: * Bleeding increases in amount * Bleeding occurs 3 or more times after treatment begins * You become worse. CARE ADVICE given per Rectal Bleeding (Adult) guideline. After Care Instructions Given Call Event Type User Date / Time Description

## 2015-03-23 NOTE — Telephone Encounter (Signed)
Patient Name: Alex Newman  DOB: 08-May-1970    Initial Comment Caller states he has blood in his stool.   Nurse Assessment  Nurse: Vickey Sages, RN, Jacquilin Date/Time (Eastern Time): 03/23/2015 8:29:08 AM  Confirm and document reason for call. If symptomatic, describe symptoms. You must click the next button to save text entered. ---Caller states he has blood in his stool.  Has the patient traveled out of the country within the last 30 days? ---No  Does the patient have any new or worsening symptoms? ---Yes  Will a triage be completed? ---Yes  Related visit to physician within the last 2 weeks? ---No  Does the PT have any chronic conditions? (i.e. diabetes, asthma, etc.) ---Yes  List chronic conditions. ---hypothyroid  Is this a behavioral health or substance abuse call? ---No     Guidelines    Guideline Title Affirmed Question Affirmed Notes  Rectal Bleeding Rectal bleeding is minimal (e.g., blood just on toilet paper, a few drops in toilet bowl) (all triage questions negative)    Final Disposition User   Home Care Vickey Sages, RN, Jacquilin    Disagree/Comply: Comply

## 2015-04-15 ENCOUNTER — Telehealth: Payer: Self-pay | Admitting: Family Medicine

## 2015-04-15 NOTE — Telephone Encounter (Signed)
Ok to refill 90, 0 refills only  He must f/u for 30 min CPX

## 2015-04-15 NOTE — Telephone Encounter (Signed)
Last office visit 06/07/2014 for Flu.  Last TSH was 04/09/2014.  Last CPE 09/27/2011.  No future appointments scheduled.  Refill?

## 2015-04-15 NOTE — Telephone Encounter (Signed)
Labs 3/29 cpx 4/3 Pt aware Please close

## 2015-04-15 NOTE — Telephone Encounter (Signed)
Please call and schedule CPE with fasting labs prior with Dr. Patsy Lager. I have sent him a 90 day supply of his thyroid medication but he will need an appointment before he can get any additional refills.

## 2015-05-21 ENCOUNTER — Other Ambulatory Visit: Payer: Self-pay | Admitting: Family Medicine

## 2015-05-23 NOTE — Telephone Encounter (Signed)
Pt called to ck on thyroid med refill; [facility]. Pt voiced understanding.

## 2015-05-31 ENCOUNTER — Other Ambulatory Visit: Payer: Self-pay | Admitting: Family Medicine

## 2015-05-31 DIAGNOSIS — Z125 Encounter for screening for malignant neoplasm of prostate: Secondary | ICD-10-CM

## 2015-05-31 DIAGNOSIS — Z79899 Other long term (current) drug therapy: Secondary | ICD-10-CM

## 2015-05-31 DIAGNOSIS — Z13220 Encounter for screening for lipoid disorders: Secondary | ICD-10-CM

## 2015-05-31 DIAGNOSIS — E039 Hypothyroidism, unspecified: Secondary | ICD-10-CM

## 2015-06-01 ENCOUNTER — Other Ambulatory Visit (INDEPENDENT_AMBULATORY_CARE_PROVIDER_SITE_OTHER): Payer: BLUE CROSS/BLUE SHIELD

## 2015-06-01 DIAGNOSIS — E039 Hypothyroidism, unspecified: Secondary | ICD-10-CM | POA: Diagnosis not present

## 2015-06-01 DIAGNOSIS — Z13220 Encounter for screening for lipoid disorders: Secondary | ICD-10-CM | POA: Diagnosis not present

## 2015-06-01 DIAGNOSIS — Z125 Encounter for screening for malignant neoplasm of prostate: Secondary | ICD-10-CM | POA: Diagnosis not present

## 2015-06-01 DIAGNOSIS — Z79899 Other long term (current) drug therapy: Secondary | ICD-10-CM

## 2015-06-01 LAB — LIPID PANEL
Cholesterol: 209 mg/dL — ABNORMAL HIGH (ref 0–200)
HDL: 34.8 mg/dL — ABNORMAL LOW (ref >39.00)
LDL Cholesterol: 141 mg/dL — ABNORMAL HIGH (ref 0–99)
NonHDL: 174.5
Total CHOL/HDL Ratio: 6
Triglycerides: 170 mg/dL — ABNORMAL HIGH (ref 0.0–149.0)
VLDL: 34 mg/dL (ref 0.0–40.0)

## 2015-06-01 LAB — CBC WITH DIFFERENTIAL/PLATELET
Basophils Absolute: 0 10*3/uL (ref 0.0–0.1)
Basophils Relative: 0.4 % (ref 0.0–3.0)
Eosinophils Absolute: 0.1 10*3/uL (ref 0.0–0.7)
Eosinophils Relative: 0.8 % (ref 0.0–5.0)
HCT: 46.1 % (ref 39.0–52.0)
Hemoglobin: 15.6 g/dL (ref 13.0–17.0)
Lymphocytes Relative: 27.5 % (ref 12.0–46.0)
Lymphs Abs: 2.4 10*3/uL (ref 0.7–4.0)
MCHC: 33.8 g/dL (ref 30.0–36.0)
MCV: 88 fl (ref 78.0–100.0)
Monocytes Absolute: 0.6 10*3/uL (ref 0.1–1.0)
Monocytes Relative: 7 % (ref 3.0–12.0)
Neutro Abs: 5.6 10*3/uL (ref 1.4–7.7)
Neutrophils Relative %: 64.3 % (ref 43.0–77.0)
Platelets: 195 10*3/uL (ref 150.0–400.0)
RBC: 5.24 Mil/uL (ref 4.22–5.81)
RDW: 14.6 % (ref 11.5–15.5)
WBC: 8.6 10*3/uL (ref 4.0–10.5)

## 2015-06-01 LAB — HEPATIC FUNCTION PANEL
ALT: 39 U/L (ref 0–53)
AST: 30 U/L (ref 0–37)
Albumin: 4 g/dL (ref 3.5–5.2)
Alkaline Phosphatase: 100 U/L (ref 39–117)
Bilirubin, Direct: 0.1 mg/dL (ref 0.0–0.3)
Total Bilirubin: 0.5 mg/dL (ref 0.2–1.2)
Total Protein: 6.9 g/dL (ref 6.0–8.3)

## 2015-06-01 LAB — PSA: PSA: 0.48 ng/mL (ref 0.10–4.00)

## 2015-06-01 LAB — BASIC METABOLIC PANEL
BUN: 14 mg/dL (ref 6–23)
CO2: 30 mEq/L (ref 19–32)
Calcium: 9.6 mg/dL (ref 8.4–10.5)
Chloride: 107 mEq/L (ref 96–112)
Creatinine, Ser: 0.84 mg/dL (ref 0.40–1.50)
GFR: 104.51 mL/min (ref >60.00)
Glucose, Bld: 125 mg/dL — ABNORMAL HIGH (ref 70–99)
Potassium: 4.6 mEq/L (ref 3.5–5.1)
Sodium: 144 mEq/L (ref 135–145)

## 2015-06-01 LAB — T4, FREE: Free T4: 0.76 ng/dL (ref 0.60–1.60)

## 2015-06-01 LAB — T3, FREE: T3, Free: 4.1 pg/mL (ref 2.3–4.2)

## 2015-06-01 LAB — TSH: TSH: 1.87 u[IU]/mL (ref 0.35–4.50)

## 2015-06-02 ENCOUNTER — Other Ambulatory Visit (INDEPENDENT_AMBULATORY_CARE_PROVIDER_SITE_OTHER): Payer: BLUE CROSS/BLUE SHIELD

## 2015-06-02 DIAGNOSIS — R739 Hyperglycemia, unspecified: Secondary | ICD-10-CM

## 2015-06-02 LAB — HEMOGLOBIN A1C: Hgb A1c MFr Bld: 6 % (ref 4.6–6.5)

## 2015-06-06 ENCOUNTER — Encounter: Payer: BLUE CROSS/BLUE SHIELD | Admitting: Family Medicine

## 2015-06-18 ENCOUNTER — Other Ambulatory Visit: Payer: Self-pay | Admitting: Family Medicine

## 2015-06-20 ENCOUNTER — Other Ambulatory Visit: Payer: Self-pay | Admitting: Family Medicine

## 2015-06-30 ENCOUNTER — Encounter: Payer: Self-pay | Admitting: Family Medicine

## 2015-06-30 ENCOUNTER — Ambulatory Visit (INDEPENDENT_AMBULATORY_CARE_PROVIDER_SITE_OTHER): Payer: BLUE CROSS/BLUE SHIELD | Admitting: Family Medicine

## 2015-06-30 VITALS — BP 112/80 | HR 92 | Temp 98.7°F | Ht 70.5 in | Wt 348.8 lb

## 2015-06-30 DIAGNOSIS — R7303 Prediabetes: Secondary | ICD-10-CM | POA: Diagnosis not present

## 2015-06-30 DIAGNOSIS — Z0000 Encounter for general adult medical examination without abnormal findings: Secondary | ICD-10-CM

## 2015-06-30 DIAGNOSIS — Z6841 Body Mass Index (BMI) 40.0 and over, adult: Secondary | ICD-10-CM

## 2015-06-30 MED ORDER — THYROID 15 MG PO TABS: ORAL_TABLET | ORAL | Status: DC

## 2015-06-30 NOTE — Progress Notes (Signed)
Pre visit review using our clinic review tool, if applicable. No additional management support is needed unless otherwise documented below in the visit note. 

## 2015-06-30 NOTE — Progress Notes (Signed)
Dr. Frederico Hamman T. Danesha Kirchoff, MD, [facility] [place], [address] Phone: [phone] Fax: [phone]  06/30/2015  Patient: Alex Newman, MRN: [id number], DOB: Aug 14, 1970, 45 y.o.  Primary Physician:  Owens Loffler, MD   Chief Complaint  Patient presents with  . Follow-up  . Medication Refill    LEVOTHYROXINE    CC: CPX, correction.  Subjective:   Alex Newman is a 45 y.o. pleasant patient who presents with the following:  Preventative Health Maintenance Visit:  Health Maintenance Summary Reviewed and updated, unless pt declines services.  Tobacco History Reviewed. Alcohol: No concerns, no excessive use Exercise Habits: harder at current weight, able to walk when weight gets down to 300 STD concerns: no risk or activity to increase risk Drug Use: None Encouraged self-testicular check  Health Maintenance  Topic Date Due  . HIV Screening  04/26/1984  . TETANUS/TDAP  04/26/1988  . INFLUENZA VACCINE  10/04/2015    There is no immunization history on file for this patient. Patient Active Problem List   Diagnosis Date Noted  . GERD (gastroesophageal reflux disease) 02/11/2013  . Hypogonadism male 11/19/2011  . MORBID OBESITY 05/18/2010  . BACK PAIN, LUMBAR 09/29/2007  . HYPERCHOLESTEROLEMIA 07/10/2007  . ALLERGIC RHINITIS 07/10/2007  . RENAL CALCULUS 07/10/2007  . HYPOTHYROIDISM 07/10/2007  . FATIGUE 06/19/2007   Past Medical History  Diagnosis Date  . Allergy   . Thyroid disease   . Hyperlipidemia   . Nephrolithiasis   . Hypogonadism male 11/19/2011  . GERD (gastroesophageal reflux disease) 02/11/2013   Past Surgical History  Procedure Laterality Date  . Ganglion cyst excision Right     wrist x 2  . Kidney stone removal      x2   Social History   Social History  . Marital Status: Married    Spouse Name: N/A  . Number of Children: 2  . Years of Education: N/A   Occupational History    . Nature conservation officer    Social History Main Topics  . Smoking status: Never Smoker   . Smokeless tobacco: Never Used  . Alcohol Use: No  . Drug Use: No  . Sexual Activity: Not on file   Other Topics Concern  . Not on file   Social History Narrative   Family History  Problem Relation Age of Onset  . Thyroid disease Mother   . Heart disease Father   . Neuropathy Father   . Stroke Maternal Grandfather   . Lung cancer Maternal Grandmother   . Bipolar disorder Mother    Allergies  Allergen Reactions  . Cetirizine Hcl     REACTION: Headache  . Penicillins   . Propoxyphene N-Acetaminophen     REACTION: ha, nausea    Medication list has been reviewed and updated.   General: Denies fever, chills, sweats. No significant weight loss. Eyes: Denies blurring,significant itching ENT: Denies earache, sore throat, and hoarseness. Cardiovascular: Denies chest pains, palpitations, dyspnea on exertion Respiratory: Denies cough, dyspnea at rest,wheeezing Breast: no concerns about lumps GI: Denies nausea, vomiting, diarrhea, constipation, change in bowel habits, abdominal pain, melena, hematochezia GU: Denies penile discharge, ED, urinary flow / outflow problems. No STD concerns. Musculoskeletal: SOME BACK PAIN OCC Derm: Denies rash, itching Neuro: Denies  paresthesias, frequent falls, frequent headaches Psych: Denies depression, anxiety Endocrine: Denies cold intolerance, heat intolerance, polydipsia Heme: Denies enlarged lymph nodes Allergy: No hayfever  Objective:   BP 112/80 mmHg  Pulse 92  Temp(Src) 98.7 F (37.1 C) (Oral)  Ht 5' 10.5" (1.791 m)  Wt 348 lb 12.8 oz (158.215 kg)  BMI 49.32 kg/m2  SpO2 96% Ideal Body Weight: Weight in (lb) to have BMI = 25: 176.4  No exam data present  GEN: well developed, well nourished, no acute distress Eyes: conjunctiva and lids normal, PERRLA, EOMI ENT: TM clear, nares clear, oral exam WNL Neck: supple, no lymphadenopathy, no  thyromegaly, no JVD Pulm: clear to auscultation and percussion, respiratory effort normal CV: regular rate and rhythm, S1-S2, no murmur, rub or gallop, no bruits, peripheral pulses normal and symmetric, no cyanosis, clubbing, edema or varicosities GI: soft, non-tender; no hepatosplenomegaly, masses; active bowel sounds all quadrants GU: no hernia, testicular mass, penile discharge Lymph: no cervical, axillary or inguinal adenopathy MSK: gait normal, muscle tone and strength WNL, no joint swelling, effusions, discoloration, crepitus  SKIN: clear, good turgor, color WNL, no rashes, lesions, or ulcerations Neuro: normal mental status, normal strength, sensation, and motion Psych: alert; oriented to person, place and time, normally interactive and not anxious or depressed in appearance. All labs reviewed with patient.  Lipids:    Component Value Date/Time   CHOL 209* 06/01/2015 0810   TRIG 170.0* 06/01/2015 0810   HDL 34.80* 06/01/2015 0810   LDLDIRECT 177.1 11/05/2012 1056   VLDL 34.0 06/01/2015 0810   CHOLHDL 6 06/01/2015 0810   CBC: CBC Latest Ref Rng 06/01/2015 01/10/2015 04/09/2014  WBC 4.0 - 10.5 K/uL 8.6 8.3 7.9  Hemoglobin 13.0 - 17.0 g/dL 15.6 16.0 15.9  Hematocrit 39.0 - 52.0 % 46.1 47.5 46.9  Platelets 150.0 - 400.0 K/uL 195.0 185 196.2    Basic Metabolic Panel:    Component Value Date/Time   NA 144 06/01/2015 0810   K 4.6 06/01/2015 0810   CL 107 06/01/2015 0810   CO2 30 06/01/2015 0810   BUN 14 06/01/2015 0810   CREATININE 0.84 06/01/2015 0810   GLUCOSE 125* 06/01/2015 0810   CALCIUM 9.6 06/01/2015 0810   Hepatic Function Latest Ref Rng 06/01/2015 01/10/2015 04/09/2014  Total Protein 6.0 - 8.3 g/dL 6.9 7.7 7.0  Albumin 3.5 - 5.2 g/dL 4.0 3.9 4.1  AST 0 - 37 U/L 30 40 27  ALT 0 - 53 U/L 39 49 42  Alk Phosphatase 39 - 117 U/L 100 103 103  Total Bilirubin 0.2 - 1.2 mg/dL 0.5 0.8 0.6  Bilirubin, Direct 0.0 - 0.3 mg/dL 0.1 - 0.1    Lab Results  Component Value Date    TSH 1.87 06/01/2015   Lab Results  Component Value Date   PSA 0.48 06/01/2015   PSA 0.60 04/09/2014   PSA 0.65 09/27/2011    Assessment and Plan:   Healthcare maintenance  Morbid obesity with BMI of 45.0-49.9, adult ([place]) - Plan: Amb ref to Medical Nutrition Therapy-MNT   Health Maintenance Exam: The patient's preventative maintenance and recommended screening tests for an annual wellness exam were reviewed in full today. Brought up to date unless services declined.  Counselled on the importance of diet, exercise, and its role in overall health and mortality. The patient's FH and SH was reviewed, including their home life, tobacco status, and drug and alcohol status.  We spent most time discussing 350 lb weight, weight loss, and diet - given risk and comorbidities, we will have him see RD for counselling.  Follow-up: No Follow-up on file. Unless noted, follow-up in 1 year for Health Maintenance Exam.  Modified Medications   Modified Medication Previous Medication  THYROID (ARMOUR THYROID) 15 MG TABLET ARMOUR THYROID 15 MG tablet      TAKE 3 TABLETS (45MG) BY MOUTH DAILY    TAKE 3 TABLETS (45MG) BY MOUTH DAILY *NEED OFFICE VISIT   Orders Placed This Encounter  Procedures  . Amb ref to Medical Nutrition Therapy-MNT    Signed,  Frederico Hamman T. Jedadiah Abdallah, MD   Patient's Medications  New Prescriptions   No medications on file  Previous Medications   KETOROLAC (TORADOL) 10 MG TABLET    Take 1 tablet (10 mg total) by mouth every 6 (six) hours as needed.   ONDANSETRON (ZOFRAN-ODT) 8 MG DISINTEGRATING TABLET    DISSOLVE 1 TABLET IN MOUTH EVERY 8 HOURSAS NEEDED FOR NAUSEA OR VOMITING   OXYCODONE-ACETAMINOPHEN (PERCOCET) 7.5-325 MG TABLET    Take 1 tablet by mouth every 4 (four) hours as needed for severe pain.   PROMETHAZINE (PHENERGAN) 25 MG TABLET    Take 0.5-1 tablets (12.5-25 mg total) by mouth every 6 (six) hours as needed for nausea or vomiting.  Modified Medications    Modified Medication Previous Medication   THYROID (ARMOUR THYROID) 15 MG TABLET ARMOUR THYROID 15 MG tablet      TAKE 3 TABLETS (45MG) BY MOUTH DAILY    TAKE 3 TABLETS (45MG) BY MOUTH DAILY *NEED OFFICE VISIT  Discontinued Medications   PANTOPRAZOLE (PROTONIX) 40 MG TABLET    Take 1 tablet (40 mg total) by mouth 2 (two) times daily.   TAMSULOSIN (FLOMAX) 0.4 MG CAPS CAPSULE    Take 1 capsule (0.4 mg total) by mouth daily after breakfast.

## 2015-07-01 ENCOUNTER — Encounter: Payer: Self-pay | Admitting: Family Medicine

## 2015-07-01 DIAGNOSIS — R7303 Prediabetes: Secondary | ICD-10-CM | POA: Insufficient documentation

## 2015-08-29 ENCOUNTER — Encounter: Payer: Self-pay | Admitting: Dietician

## 2015-08-29 ENCOUNTER — Encounter: Payer: BLUE CROSS/BLUE SHIELD | Attending: Family Medicine | Admitting: Dietician

## 2015-08-29 NOTE — Progress Notes (Signed)
Medical Nutrition Therapy: Visit start time: 1600  end time: 1700  Assessment:  Diagnosis: obesity Past medical history: hypothyroidism, hyperlipidemia, hyperglycemia Psychosocial issues/ stress concerns: none Preferred learning method:  . Visual  Current weight: 348.7lbs with shoes  Height: 5'11" Medications, supplements: reviewed list in chart with patient  Progress and evaluation: Patient reports some history of weight gain due to medication changes for hypothyroidism.      His weight has fluctuated 20 lbs or more in short periods of time over the past 10-15 years.       He has recently stopped regular sodas, drinks some diet sodas without aspartame. Decreased bread, fries, and fried meats, less potatoes, rice, pasta -- smaller portions.                 He has lost about 15lbs in the past 3 weeks. Ongoing issues with fatigue, energy, am nausea.   Physical activity: no regular exercise due to knee pain from excess weight. Will resume when weight gets to 300lbs or below.   Dietary Intake:  Usual eating pattern includes 3 meals and 0 snacks per day. Dining out frequency: 6-8 meals per week. Up at 5:30am, usually feels nauseated until 8:30-9am then eats.   Breakfast: granola or nutri grain bar, nuts; weekends 2-3 eggs Snack: none Lunch: usually subway 6-inch roast chicken and veggies, vinegar; mcdonald grilled chicken sandwich, no mayo; occasionally chopped bbq sandwich no sides.  Snack: none Supper: often salad in past 3 weeks with low sugar olive oil vinaigrette with grilled chicken strips 4x a week. Pork chop with indiv. Serving vegetable.  Snack: none loves skim milk about 2hrs after eating Beverages: mostly water, skim milk, 1-2 diet sodas daily  Nutrition Care Education: Topics covered: weight management Basic nutrition: basic food groups, appropriate nutrient balance, appropriate meal and snack schedule    Weight control: determining reasonable weight goal, guidance for  1750kcal daily intake, planning balanced meals, controlling food portions Other lifestyle changes:  Options for gradually increasing physical activity as tolerated  Nutritional Diagnosis:  [place]-3.3 Overweight/obesity As related to history of weight fluctuation, hypothyroidism, excess calories.  As evidenced by patient report.  Intervention: Instruction as noted above.   Set goals with patient input.   Commended patient for changes made thus far.   Education Materials given:  . Food lists/ Planning A Balanced Meal . Sample meal pattern/ menus: Quick and Healthy Meal Ideas . Goals/ instructions  Learner/ who was taught:  . Patient   Level of understanding: Marland Kitchen. Verbalizes/ demonstrates competency  Demonstrated degree of understanding via:   Teach back Learning barriers: . None  Willingness to learn/ readiness for change: . Eager, change in progress  Monitoring and Evaluation:  Dietary intake, exercise, and body weight      follow up: 10/10/15

## 2015-08-29 NOTE — Patient Instructions (Signed)
   Improve the nutrient balance in meals: small amount of carb and ideally protein in the mornings, add veg or fruit with a sandwich for lunch.   Use menus for balanced meal ideas.  Increase movement, light physical activity as able.

## 2015-09-26 ENCOUNTER — Telehealth: Payer: Self-pay | Admitting: Family Medicine

## 2015-09-26 MED ORDER — TIZANIDINE HCL 4 MG PO TABS: 4.0000 mg | ORAL_TABLET | Freq: Every day | ORAL | 1 refills | Status: DC

## 2015-09-26 NOTE — Telephone Encounter (Signed)
°  Patient Name: Alex Newman  DOB: 29-Oct-1970    Initial Comment Caller states c/o lower back pain with nerve pain radiating down legs and leg weakness.   Nurse Assessment  Nurse: Deatra James RN, Corrie Dandy Date/Time (Eastern Time): 09/26/2015 8:10:11 AM  Confirm and document reason for call. If symptomatic, describe symptoms. You must click the next button to save text entered. ---Patient states he is having back pain and the pain is going down his legs  Has the patient traveled out of the country within the last 30 days? ---No  Does the patient have any new or worsening symptoms? ---Yes  Will a triage be completed? ---Yes  Related visit to physician within the last 2 weeks? ---No  Does the PT have any chronic conditions? (i.e. diabetes, asthma, etc.) ---Yes  List chronic conditions. ---"thyroid. kidney stones, back  Is this a behavioral health or substance abuse call? ---No     Guidelines    Guideline Title Affirmed Question Affirmed Notes  Back Pain [1] Pain radiates into the thigh or further down the leg AND [2] one leg    Final Disposition User   See PCP When Office is Open (within 3 days) Deatra James, RN, Corrie Dandy    Comments  After triage patient states he is having so much pain that it is hard for him to walk. States they normally give him muscle relaxers for this and he would like to see if they can call some in for it now.  Offered appt and he states he doesn't need to drive like this and he will have to lay there until his wife comes home.  Patient states he hasn't taken any pain medicine for this that he wants to get some muscle relaxers   Disagree/Comply: Disagree  Disagree/Comply Reason: Disagree with instructions

## 2015-09-26 NOTE — Telephone Encounter (Signed)
Zanaflex sent into [facility].  Mr. Critchfield notified.

## 2015-09-26 NOTE — Telephone Encounter (Signed)
Pt states he has pulled his back again and he cannot walk due to it being so painful.  Patient wants to know if he can get a refill on his muscle relaxer.

## 2015-09-26 NOTE — Telephone Encounter (Signed)
I think that is reasonable, but I have not to my knowledge written for a muscle relaxer for him in the last 5 years.   zanaflex 4 mg, 1 po QHS, #30, 1 ref

## 2015-09-28 ENCOUNTER — Telehealth: Payer: Self-pay

## 2015-09-28 DIAGNOSIS — M545 Low back pain: Secondary | ICD-10-CM | POA: Diagnosis not present

## 2015-09-28 NOTE — Telephone Encounter (Signed)
Pt still having back pain and pain goes down lt leg. Pt having difficulty in walking. Pt wants to be seen today. No available at [facility] but appts offered at [facility]; pt said too far for him to go. Pt is going to go to [facility] when can get in touch with his son to come and assist him to [facility]. Pt said not severe enough to contact 911. Pt will cb if needed.

## 2015-10-02 ENCOUNTER — Emergency Department: Payer: BLUE CROSS/BLUE SHIELD

## 2015-10-02 ENCOUNTER — Emergency Department
Admission: EM | Admit: 2015-10-02 | Discharge: 2015-10-02 | Disposition: A | Discharge status: Home / Self Care | Payer: BLUE CROSS/BLUE SHIELD | Attending: Emergency Medicine | Admitting: Emergency Medicine

## 2015-10-02 ENCOUNTER — Encounter: Payer: Self-pay | Admitting: Emergency Medicine

## 2015-10-02 DIAGNOSIS — M47817 Spondylosis without myelopathy or radiculopathy, lumbosacral region: Secondary | ICD-10-CM

## 2015-10-02 DIAGNOSIS — M545 Low back pain, unspecified: Secondary | ICD-10-CM

## 2015-10-02 DIAGNOSIS — M546 Pain in thoracic spine: Secondary | ICD-10-CM | POA: Diagnosis not present

## 2015-10-02 DIAGNOSIS — M1288 Other specific arthropathies, not elsewhere classified, other specified site: Secondary | ICD-10-CM | POA: Insufficient documentation

## 2015-10-02 DIAGNOSIS — M129 Arthropathy, unspecified: Secondary | ICD-10-CM | POA: Diagnosis not present

## 2015-10-02 DIAGNOSIS — Z79899 Other long term (current) drug therapy: Secondary | ICD-10-CM | POA: Insufficient documentation

## 2015-10-02 DIAGNOSIS — E785 Hyperlipidemia, unspecified: Secondary | ICD-10-CM | POA: Insufficient documentation

## 2015-10-02 DIAGNOSIS — E039 Hypothyroidism, unspecified: Secondary | ICD-10-CM | POA: Insufficient documentation

## 2015-10-02 MED ORDER — ORPHENADRINE CITRATE 30 MG/ML IJ SOLN
60.0000 mg | INTRAMUSCULAR | Status: AC
  Administered 2015-10-02: 60 mg via INTRAMUSCULAR
  Filled 2015-10-02: qty 2

## 2015-10-02 MED ORDER — KETOROLAC TROMETHAMINE 60 MG/2ML IM SOLN
30.0000 mg | Freq: Once | INTRAMUSCULAR | Status: AC
  Administered 2015-10-02: 30 mg via INTRAMUSCULAR
  Filled 2015-10-02: qty 2

## 2015-10-02 MED ORDER — KETOROLAC TROMETHAMINE 10 MG PO TABS: 10.0000 mg | ORAL_TABLET | Freq: Three times a day (TID) | ORAL | 0 refills | Status: DC

## 2015-10-02 MED ORDER — CYCLOBENZAPRINE HCL 10 MG PO TABS: 10.0000 mg | ORAL_TABLET | Freq: Three times a day (TID) | ORAL | 0 refills | Status: DC | PRN

## 2015-10-02 NOTE — Discharge Instructions (Signed)
Take the prescription meds as directed. Your back pain appears to be related to your arthritic changes in the lower back. Follow-up with your provider for continued symptoms.

## 2015-10-02 NOTE — ED Notes (Signed)
See triage note   conts to have lower back pain  States pain started about 1 week ago ambulates well to treatment area

## 2015-10-02 NOTE — ED Provider Notes (Signed)
[facility] Emergency Department Provider Note ____________________________________________  Time seen: 1752  I have reviewed the triage vital signs and the nursing notes.  HISTORY  Chief Complaint  Back Pain  HPI Alex Newman is a 45 y.o. male is to the ED for evaluation of continued low back pain with onset Saturday, but the week prior to arrival. Patient describes midline low back pain with onset one day after he did his routine mowing and weed eating of the yard. The patient called his primary care provider on Monday, and was called to get a prescription muscle relaxant. By Wednesday the patient is neither any significant improvement and presented himself to [facility] urgent care. He received an injection of Toradol, a prescription for Motrin, and a prescription for Flexeril. He reported improvement of his symptoms but denies resolution. He is unclear if the once daily anti-inflammatories offer any benefit. He denies any distal paresthesias, foot drop, or incontinence. He also denies any injury or accident. He describes the pain as sharp with movement and dull and constant at rest. He reports a remote history of multilevelbulging lumbar disc from a work-related injury some 10-15 years prior. She describes intermittent flares of muscle pain and spasms, that usually resolve within a few days of dosing muscle relaxants.  Past Medical History:  Diagnosis Date  . Allergic rhinitis due to pollen   . GERD (gastroesophageal reflux disease) 02/11/2013  . Hyperlipidemia   . Hypothyroidism   . Morbid obesity with BMI of 45.0-49.9, adult ([facility])   . Nephrolithiasis     Patient Active Problem List   Diagnosis Date Noted  . Prediabetes 07/01/2015  . GERD (gastroesophageal reflux disease) 02/11/2013  . MORBID OBESITY 05/18/2010  . BACK PAIN, LUMBAR 09/29/2007  . HYPERCHOLESTEROLEMIA 07/10/2007  . ALLERGIC RHINITIS 07/10/2007  . RENAL CALCULUS 07/10/2007  . HYPOTHYROIDISM  07/10/2007    Past Surgical History:  Procedure Laterality Date  . GANGLION CYST EXCISION Right    wrist x 2  . kidney stone removal     x2    Current Outpatient Rx  . Order #: [id number] Class: Print  . Order #: [id number] Class: Print  . Order #: [id number] Class: Normal  . Order #: [id number] Class: Print  . Order #: [id number] Class: Normal  . Order #: [id number] Class: Normal  . Order #: [id number] Class: Normal   Allergies Cetirizine hcl; Penicillins; and Propoxyphene n-acetaminophen  Family History  Problem Relation Age of Onset  . Thyroid disease Mother   . Bipolar disorder Mother   . Heart disease Father   . Neuropathy Father   . Stroke Maternal Grandfather   . Lung cancer Maternal Grandmother     Social History Social History  Substance Use Topics  . Smoking status: Never Smoker  . Smokeless tobacco: Never Used  . Alcohol use No   Review of Systems  Constitutional: Negative for fever. Cardiovascular: Negative for chest pain. Respiratory: Negative for shortness of breath. Gastrointestinal: Negative for abdominal pain, vomiting and diarrhea. Genitourinary: Negative for dysuria. Musculoskeletal: Positive for back pain. Neurological: Negative for headaches, focal weakness or numbness. ____________________________________________  PHYSICAL EXAM:  VITAL SIGNS: ED Triage Vitals  Enc Vitals Group     BP 10/02/15 1643 138/87     Pulse Rate 10/02/15 1643 88     Resp 10/02/15 1643 18     Temp 10/02/15 1643 98 F (36.7 C)     Temp Source 10/02/15 1643 Oral     SpO2 10/02/15 1643 96 %     Weight  10/02/15 1643 (!) 340 lb (154.2 kg)     Height 10/02/15 1643 5\' 11"  (1.803 m)     Head Circumference --      Peak Flow --      Pain Score 10/02/15 1644 6     Pain Loc --      Pain Edu? --      Excl. in GC? --    Constitutional: Alert and oriented. Well appearing and in no distress. Head: Normocephalic and atraumatic. Cardiovascular: Normal rate, regular rhythm.   Respiratory: Normal respiratory effort. No wheezes/rales/rhonchi. Gastrointestinal: Soft and nontender. No distention. Musculoskeletal: Normal spinal alignment without midline tenderness, spasm, deformity, or step-off. Normal sit-stand transition. Negative seated SLR bilaterally. Nontender with normal range of motion in all extremities.  Neurologic:  CN II-XI grossly intact. Normal LE DTRs bilaterally. Normal gait without ataxia. Normal speech and language. No gross focal neurologic deficits are appreciated. Skin:  Skin is warm, dry and intact. No rash noted. Psychiatric: Mood and affect are normal. Patient exhibits appropriate insight and judgment. ____________________________________________   RADIOLOGY  LS Spine Complete IMPRESSION: 1. No acute osseous abnormality. 2. Suspect mild facet arthropathy at L5-S1.  I, Cyndia Degraff, Charlesetta Ivory, personally viewed and evaluated these images (plain radiographs) as part of my medical decision making, as well as reviewing the written report by the radiologist. ____________________________________________  PROCEDURES  Toradol 30 mg IM Norflex 60 mg IM ____________________________________________  INITIAL IMPRESSION / ASSESSMENT AND PLAN / ED COURSE  Patient with what appears to be mechanical low back pain secondary to lumbar facet arthropathy. His x-rays reviewed and his pain appears to be consistent with his L5-S1 arthropathy. He will be discharged with a prescription for ketorolac to dose and blue of his meloxicam. He'll also be provided with a refill of Flexeril. He is given a work note for tomorrow and is encouraged to follow up with his primary care provider for ongoing symptom management. Return precautions are reviewed.  Clinical Course   ____________________________________________  FINAL CLINICAL IMPRESSION(S) / ED DIAGNOSES  Final diagnoses:  Midline low back pain without sciatica  Facet arthropathy, lumbosacral     Lissa Hoard, PA-C 10/02/15 2019    Jennye Moccasin, MD 10/02/15 2048

## 2015-10-02 NOTE — ED Triage Notes (Signed)
Pt c/o lower back pain x1 week. Pt states was prescribed muscle relaxers by PCP, no change in pain level, was seen by [facility] on [address], was told he was having muscle spasms and given a shot of toradol, pt states sharp pain subsided after shot but continues to have a steady lower back pain. Denies injury at this time.

## 2015-10-10 ENCOUNTER — Ambulatory Visit: Payer: BLUE CROSS/BLUE SHIELD | Admitting: Dietician

## 2015-10-19 ENCOUNTER — Encounter: Payer: Self-pay | Admitting: Family Medicine

## 2015-10-19 ENCOUNTER — Ambulatory Visit (INDEPENDENT_AMBULATORY_CARE_PROVIDER_SITE_OTHER): Payer: BLUE CROSS/BLUE SHIELD | Admitting: Family Medicine

## 2015-10-19 VITALS — BP 130/90 | HR 96 | Temp 98.3°F | Ht 70.5 in | Wt 346.2 lb

## 2015-10-19 DIAGNOSIS — M545 Low back pain, unspecified: Secondary | ICD-10-CM

## 2015-10-19 MED ORDER — PREDNISONE 20 MG PO TABS: ORAL_TABLET | ORAL | 0 refills | Status: DC

## 2015-10-19 MED ORDER — CYCLOBENZAPRINE HCL 10 MG PO TABS: 10.0000 mg | ORAL_TABLET | Freq: Three times a day (TID) | ORAL | 0 refills | Status: DC | PRN

## 2015-10-19 NOTE — Progress Notes (Signed)
Pre visit review using our clinic review tool, if applicable. No additional management support is needed unless otherwise documented below in the visit note. 

## 2015-10-19 NOTE — Progress Notes (Signed)
Dr. Karleen HampshireSpencer T. Aveya Beal, MD, CAQ Sports Medicine Primary Care and Sports Medicine [address] [place], [id number] Phone: [phone] Fax: [phone]  10/19/2015  Patient: Alex Newman Lorah, MRN: [id number], DOB: [birth year], 45 y.o.  Primary Physician:  Hannah BeatSpencer Bernardette Waldron, MD   Chief Complaint  Patient presents with  . Follow-up    ED [facility]-back pain, no injury   Subjective:   Alex Newman Farfan is a 45 y.o. very pleasant male patient who presents with the following: Back Pain  ongoing for approximately: 3 1/2 weeks The patient has had back pain before. The back pain is localized into the lumbar spine area. They also describe no radiculopathy.  3+ weeks when started  Now walking from his back Did normal sat routine, did laundry and felt his back tighten up, woke up Monday felt like screaming and could not get out of bed for 3 days.   Thursday was able to go to UC.  Sunday could not get up and walk and was given 2 more sides.  Went to the ER  No numbness or tingling. No bowel or bladder incontinence. No focal weakness. Prior interventions: none Physical therapy: No Chiropractic manipulations: No Acupuncture: No Osteopathic manipulation: No Heat or cold: Minimal effect  Past Medical History, Surgical History, Family History, Medications, Allergies have been reviewed and updated if relevant.  Patient Active Problem List   Diagnosis Date Noted  . Prediabetes 07/01/2015  . GERD (gastroesophageal reflux disease) 02/11/2013  . MORBID OBESITY 05/18/2010  . BACK PAIN, LUMBAR 09/29/2007  . HYPERCHOLESTEROLEMIA 07/10/2007  . ALLERGIC RHINITIS 07/10/2007  . RENAL CALCULUS 07/10/2007  . HYPOTHYROIDISM 07/10/2007    Past Medical History:  Diagnosis Date  . Allergic rhinitis due to pollen   . GERD (gastroesophageal reflux disease) 02/11/2013  . Hyperlipidemia   . Hypothyroidism   . Morbid obesity with BMI of 45.0-49.9, adult ([facility])   . Nephrolithiasis     Past  Surgical History:  Procedure Laterality Date  . GANGLION CYST EXCISION Right    wrist x 2  . kidney stone removal     x2    Social History   Social History  . Marital status: Married    Spouse name: N/A  . Number of children: 2  . Years of education: N/A   Occupational History  . service writer [employer]   Social History Main Topics  . Smoking status: Never Smoker  . Smokeless tobacco: Never Used  . Alcohol use No  . Drug use: No  . Sexual activity: Yes    Partners: Female   Other Topics Concern  . Not on file   Social History Narrative  . No narrative on file    Family History  Problem Relation Age of Onset  . Thyroid disease Mother   . Bipolar disorder Mother   . Heart disease Father   . Neuropathy Father   . Stroke Maternal Grandfather   . Lung cancer Maternal Grandmother     Allergies  Allergen Reactions  . Cetirizine Hcl     REACTION: Headache  . Penicillins   . Propoxyphene N-Acetaminophen     REACTION: ha, nausea    Medication list reviewed and updated in full in [facility].  GEN: No fevers, chills. Nontoxic. Primarily MSK c/o today. MSK: Detailed in the HPI GI: tolerating PO intake without difficulty Neuro: As above  Otherwise the pertinent positives of the ROS are noted above.    Objective:   Blood  pressure 130/90, pulse 96, temperature 98.3 F (36.8 C), temperature source Oral, height 5' 10.5" (1.791 m), weight (!) 346 lb 4 oz (157.1 kg).  Gen: Well-developed,well-nourished,in no acute distress; alert,appropriate and cooperative throughout examination HEENT: Normocephalic and atraumatic without obvious abnormalities.  Ears, externally no deformities Pulm: Breathing comfortably in no respiratory distress Range of motion at  the waist: Flexion, rotation and lateral bending: restriction in flexion mostly, less in all other directions  No echymosis or edema Rises to examination table with no difficulty Gait:  minimally antalgic  Inspection/Deformity: No abnormality Paraspinus T:  l2-s1  B Ankle Dorsiflexion (L5,4): 5/5 B Great Toe Dorsiflexion (L5,4): 5/5 Heel Walk (L5): WNL Toe Walk (S1): WNL Rise/Squat (L4): WNL, mild pain  SENSORY B Medial Foot (L4): WNL B Dorsum (L5): WNL B Lateral (S1): WNL Light Touch: WNL Pinprick: WNL  REFLEXES Knee (L4): 2+ Ankle (S1): 2+  B SLR, seated: neg B SLR, supine: neg B FABER: neg B Reverse FABER: neg B Greater Troch: NT B Log Roll: neg B Stork: NT B Sciatic Notch: NT  Radiology: Dg Lumbar Spine Complete  Result Date: 10/02/2015 CLINICAL DATA:  Lumbosacral back pain without radiation. No known injury. Pain for 8 days. EXAM: LUMBAR SPINE - COMPLETE 4+ VIEW COMPARISON:  None. FINDINGS: The alignment is maintained. Vertebral body heights are normal. There is no listhesis. The posterior elements are intact. Disc spaces are preserved. Mild ventral spurring at the thoracolumbar junction. No fracture. Sacroiliac joints are symmetric. Mild facet arthropathy suspected at L5-S1. IMPRESSION: 1. No acute osseous abnormality. 2. Suspect mild facet arthropathy at L5-S1. Electronically Signed   By: Rubye OaksMelanie  Ehinger M.D.   On: 10/02/2015 18:37   Assessment and Plan:   Midline low back pain without sciatica  Slowly improving - more aggressive care for now, start ROM, steroids  Anatomy reviewed. Conservative algorithms for acute back pain generally begin with the following: NSAIDS, Muscle Relaxants, Mild pain medication  Start with medications, core rehab, and progress from there following low back pain algorithm. No red flags are present.  Follow-up: No Follow-up on file.  New Prescriptions   PREDNISONE (DELTASONE) 20 MG TABLET    2 tabs po for 5 days, then 1 tab po for 5 days   Modified Medications   Modified Medication Previous Medication   CYCLOBENZAPRINE (FLEXERIL) 10 MG TABLET cyclobenzaprine (FLEXERIL) 10 MG tablet      Take 1 tablet (10 mg  total) by mouth 3 (three) times daily as needed for muscle spasms.    Take 1 tablet (10 mg total) by mouth 3 (three) times daily as needed for muscle spasms.   Signed,  Elpidio GaleaSpencer T. Isma Tietje, MD   Patient's Medications  New Prescriptions   PREDNISONE (DELTASONE) 20 MG TABLET    2 tabs po for 5 days, then 1 tab po for 5 days  Previous Medications   KETOROLAC (TORADOL) 10 MG TABLET    Take 1 tablet (10 mg total) by mouth every 8 (eight) hours.   ONDANSETRON (ZOFRAN-ODT) 8 MG DISINTEGRATING TABLET    DISSOLVE 1 TABLET IN MOUTH EVERY 8 HOURSAS NEEDED FOR NAUSEA OR VOMITING   PROMETHAZINE (PHENERGAN) 25 MG TABLET    Take 0.5-1 tablets (12.5-25 mg total) by mouth every 6 (six) hours as needed for nausea or vomiting.   THYROID (ARMOUR THYROID) 15 MG TABLET    TAKE 3 TABLETS (45MG ) BY MOUTH DAILY  Modified Medications   Modified Medication Previous Medication   CYCLOBENZAPRINE (FLEXERIL) 10 MG TABLET cyclobenzaprine (  FLEXERIL) 10 MG tablet      Take 1 tablet (10 mg total) by mouth 3 (three) times daily as needed for muscle spasms.    Take 1 tablet (10 mg total) by mouth 3 (three) times daily as needed for muscle spasms.  Discontinued Medications   OXYCODONE-ACETAMINOPHEN (PERCOCET) 7.5-325 MG TABLET    Take 1 tablet by mouth every 4 (four) hours as needed for severe pain.   TIZANIDINE (ZANAFLEX) 4 MG TABLET    Take 1 tablet (4 mg total) by mouth at bedtime.

## 2015-10-19 NOTE — Addendum Note (Signed)
Addended by: Hannah BeatOPLAND, Zoua Caporaso on: 10/19/2015 02:55 PM   Modules accepted: Orders

## 2015-10-25 ENCOUNTER — Telehealth: Payer: Self-pay

## 2015-10-25 NOTE — Telephone Encounter (Signed)
Prednisone can cause nausea and vomiting, but if it has helped with back pain we could send in nausea med for you to try with it. Let me know.

## 2015-10-25 NOTE — Telephone Encounter (Signed)
Pt left v/m; pt was seen on 10/19/15; pt wants to know if prednisone can cause N&V; for the last 2 days pt has been very nauseated and has vomited x 2 today. Pt request cb and if side effect what to do.

## 2015-10-25 NOTE — Telephone Encounter (Signed)
[name] notified as instructed by telephone.  He actually has  Zofran 8 mg ODT and he took one earlier, after speaking with pharmacist to make sure it was safe to take with prednisone.  He states he feels some better since taking it.  Advised to continue using Zofran every 8 hours  if needed while on the Prednisone.

## 2015-11-25 ENCOUNTER — Encounter: Payer: Self-pay | Admitting: Dietician

## 2015-11-25 NOTE — Progress Notes (Signed)
Have not heard from patient to reschedule appointment which he missed on 10/10/15. Sent discharge letter to MD.

## 2016-03-13 ENCOUNTER — Encounter: Payer: Self-pay | Admitting: Family Medicine

## 2016-03-13 ENCOUNTER — Ambulatory Visit (INDEPENDENT_AMBULATORY_CARE_PROVIDER_SITE_OTHER): Payer: BLUE CROSS/BLUE SHIELD | Admitting: Family Medicine

## 2016-03-13 DIAGNOSIS — R6889 Other general symptoms and signs: Secondary | ICD-10-CM | POA: Insufficient documentation

## 2016-03-13 MED ORDER — BENZONATATE 200 MG PO CAPS: 200.0000 mg | ORAL_CAPSULE | Freq: Three times a day (TID) | ORAL | 1 refills | Status: DC | PRN

## 2016-03-13 MED ORDER — OSELTAMIVIR PHOSPHATE 75 MG PO CAPS: 75.0000 mg | ORAL_CAPSULE | Freq: Two times a day (BID) | ORAL | 0 refills | Status: DC

## 2016-03-13 NOTE — Assessment & Plan Note (Signed)
Known exposure, typical sx and onset, nontoxic, okay for outpatient f/u.  D/w pt about vaccination in general.  Start tamiflu.  Tessalon for cough.  Rest and fluids.  See AVS.  Pt understood.

## 2016-03-13 NOTE — Progress Notes (Signed)
duration of symptoms: since yesterday, quick onset yesterday. rhinorrhea:yes congestion:yes ear pain:no sore throat:yes Cough:yes, to the point of vomiting.  Myalgias: yes, diffuse aches Facial pain noted.   Flu exposure last week.  Coworker prev tested pos for flu per patient report.  Not SOB.  Deep breath triggers a cough.   Per HPI unless specifically indicated in ROS section   Meds, vitals, and allergies reviewed.   GEN: nad, alert and oriented HEENT: mucous membranes moist, TM w/o erythema, nasal epithelium injected, OP with cobblestoning NECK: supple w/o LA CV: rrr. PULM: ctab, no inc wob, no focal dec iN BS ABD: soft, +bs EXT: no edema

## 2016-03-13 NOTE — Patient Instructions (Signed)
Presumed flu.  If short of breath, then go to the ER.  Start tamiflu.  Tessalon for cough.  Get a flu shot each fall.  Take care.  Glad to see you.  Update us as needed.

## 2016-03-13 NOTE — Progress Notes (Signed)
Pre visit review using our clinic review tool, if applicable. No additional management support is needed unless otherwise documented below in the visit note. 

## 2016-06-07 ENCOUNTER — Other Ambulatory Visit: Payer: Self-pay | Admitting: Family Medicine

## 2016-06-07 NOTE — Telephone Encounter (Signed)
Last office visit 03/13/16 with Dr. Para March.  Last refilled 09/18/14 for #30 with 2 refills.  Ok to refill?

## 2016-07-12 ENCOUNTER — Other Ambulatory Visit (INDEPENDENT_AMBULATORY_CARE_PROVIDER_SITE_OTHER): Payer: BLUE CROSS/BLUE SHIELD

## 2016-07-12 DIAGNOSIS — Z0000 Encounter for general adult medical examination without abnormal findings: Secondary | ICD-10-CM

## 2016-07-12 DIAGNOSIS — R7303 Prediabetes: Secondary | ICD-10-CM

## 2016-07-12 DIAGNOSIS — E7800 Pure hypercholesterolemia, unspecified: Secondary | ICD-10-CM | POA: Diagnosis not present

## 2016-07-12 DIAGNOSIS — E038 Other specified hypothyroidism: Secondary | ICD-10-CM | POA: Diagnosis not present

## 2016-07-12 DIAGNOSIS — Z79899 Other long term (current) drug therapy: Secondary | ICD-10-CM | POA: Diagnosis not present

## 2016-07-12 LAB — LIPID PANEL
Cholesterol: 216 mg/dL — ABNORMAL HIGH (ref 0–200)
HDL: 33 mg/dL — ABNORMAL LOW (ref >39.00)
LDL Cholesterol: 150 mg/dL — ABNORMAL HIGH (ref 0–99)
NonHDL: 183.11
Total CHOL/HDL Ratio: 7
Triglycerides: 164 mg/dL — ABNORMAL HIGH (ref 0.0–149.0)
VLDL: 32.8 mg/dL (ref 0.0–40.0)

## 2016-07-12 LAB — HEPATIC FUNCTION PANEL
ALT: 37 U/L (ref 0–53)
AST: 35 U/L (ref 0–37)
Albumin: 4.2 g/dL (ref 3.5–5.2)
Alkaline Phosphatase: 111 U/L (ref 39–117)
Bilirubin, Direct: 0.1 mg/dL (ref 0.0–0.3)
Total Bilirubin: 0.8 mg/dL (ref 0.2–1.2)
Total Protein: 6.9 g/dL (ref 6.0–8.3)

## 2016-07-12 LAB — T3, FREE: T3, Free: 3.8 pg/mL (ref 2.3–4.2)

## 2016-07-12 LAB — PSA: PSA: 0.99 ng/mL (ref 0.10–4.00)

## 2016-07-12 LAB — BASIC METABOLIC PANEL
BUN: 12 mg/dL (ref 6–23)
CO2: 28 mEq/L (ref 19–32)
Calcium: 9.4 mg/dL (ref 8.4–10.5)
Chloride: 105 mEq/L (ref 96–112)
Creatinine, Ser: 0.97 mg/dL (ref 0.40–1.50)
GFR: 88.09 mL/min (ref >60.00)
Glucose, Bld: 139 mg/dL — ABNORMAL HIGH (ref 70–99)
Potassium: 4.3 mEq/L (ref 3.5–5.1)
Sodium: 141 mEq/L (ref 135–145)

## 2016-07-12 LAB — T4, FREE: Free T4: 0.69 ng/dL (ref 0.60–1.60)

## 2016-07-12 LAB — HEMOGLOBIN A1C: Hgb A1c MFr Bld: 6.5 % (ref 4.6–6.5)

## 2016-07-12 LAB — TSH: TSH: 1.71 u[IU]/mL (ref 0.35–4.50)

## 2016-07-16 ENCOUNTER — Encounter: Payer: BLUE CROSS/BLUE SHIELD | Admitting: Family Medicine

## 2016-07-28 ENCOUNTER — Other Ambulatory Visit: Payer: Self-pay | Admitting: Family Medicine

## 2016-11-02 DIAGNOSIS — K047 Periapical abscess without sinus: Secondary | ICD-10-CM | POA: Diagnosis not present

## 2017-02-06 ENCOUNTER — Other Ambulatory Visit: Payer: Self-pay | Admitting: Family Medicine

## 2017-02-06 NOTE — Telephone Encounter (Signed)
Last office visit 03/13/2016 with Dr. Para Marchuncan for flu sxs.  Last seen PCP 10/19/2015.  No future appointments.  Refill?

## 2017-04-25 DIAGNOSIS — J111 Influenza due to unidentified influenza virus with other respiratory manifestations: Secondary | ICD-10-CM | POA: Diagnosis not present

## 2017-04-25 DIAGNOSIS — M791 Myalgia, unspecified site: Secondary | ICD-10-CM | POA: Diagnosis not present

## 2017-05-31 ENCOUNTER — Other Ambulatory Visit: Payer: Self-pay | Admitting: *Deleted

## 2017-05-31 MED ORDER — THYROID 15 MG PO TABS: 45.0000 mg | ORAL_TABLET | Freq: Every day | ORAL | 0 refills | Status: DC

## 2017-07-31 ENCOUNTER — Telehealth: Payer: Self-pay | Admitting: Family Medicine

## 2017-07-31 DIAGNOSIS — Z125 Encounter for screening for malignant neoplasm of prostate: Secondary | ICD-10-CM

## 2017-07-31 DIAGNOSIS — E785 Hyperlipidemia, unspecified: Secondary | ICD-10-CM

## 2017-07-31 DIAGNOSIS — E039 Hypothyroidism, unspecified: Secondary | ICD-10-CM

## 2017-07-31 DIAGNOSIS — Z79899 Other long term (current) drug therapy: Secondary | ICD-10-CM

## 2017-07-31 NOTE — Telephone Encounter (Signed)
Attempted to contact pt and schedule CPE; cancelled 07/2017. Line busy

## 2017-07-31 NOTE — Telephone Encounter (Signed)
Copied from CRM [id number]. Topic: Quick Communication - See Telephone Encounter >> Jul 31, 2017 11:29 AM Diana Eves B wrote: CRM for notification. See Telephone encounter for: 07/31/17.  Pt is wanting to have his thyroid checked. Pt is on his last refill of thyroid medication. He is hoping to come in Friday morning due to already having approval for work.

## 2017-07-31 NOTE — Telephone Encounter (Signed)
FLP, E78.5 Cbc with diff, HFP, BMET: Z79.899 PSA: Z12.5 TSH, Free T3, Free T4: E03.9

## 2017-07-31 NOTE — Telephone Encounter (Signed)
Lab appointment scheduled for fasting CPE labs on 08/02/2017 at 7:30 am.  He will need to check his schedule and call back to schedule CPE with Dr. Patsy Lager.  Will forward to Dr. Patsy Lager to order future labs.

## 2017-08-02 ENCOUNTER — Other Ambulatory Visit (INDEPENDENT_AMBULATORY_CARE_PROVIDER_SITE_OTHER): Payer: BLUE CROSS/BLUE SHIELD

## 2017-08-02 DIAGNOSIS — E039 Hypothyroidism, unspecified: Secondary | ICD-10-CM

## 2017-08-02 DIAGNOSIS — E785 Hyperlipidemia, unspecified: Secondary | ICD-10-CM | POA: Diagnosis not present

## 2017-08-02 DIAGNOSIS — Z125 Encounter for screening for malignant neoplasm of prostate: Secondary | ICD-10-CM

## 2017-08-02 DIAGNOSIS — Z79899 Other long term (current) drug therapy: Secondary | ICD-10-CM

## 2017-08-02 LAB — BASIC METABOLIC PANEL
BUN: 10 mg/dL (ref 6–23)
CO2: 27 mEq/L (ref 19–32)
Calcium: 9.3 mg/dL (ref 8.4–10.5)
Chloride: 104 mEq/L (ref 96–112)
Creatinine, Ser: 0.84 mg/dL (ref 0.40–1.50)
GFR: 103.54 mL/min (ref >60.00)
Glucose, Bld: 168 mg/dL — ABNORMAL HIGH (ref 70–99)
Potassium: 4.3 mEq/L (ref 3.5–5.1)
Sodium: 141 mEq/L (ref 135–145)

## 2017-08-02 LAB — HEPATIC FUNCTION PANEL
ALT: 42 U/L (ref 0–53)
AST: 37 U/L (ref 0–37)
Albumin: 4 g/dL (ref 3.5–5.2)
Alkaline Phosphatase: 110 U/L (ref 39–117)
Bilirubin, Direct: 0.2 mg/dL (ref 0.0–0.3)
Total Bilirubin: 0.8 mg/dL (ref 0.2–1.2)
Total Protein: 6.9 g/dL (ref 6.0–8.3)

## 2017-08-02 LAB — CBC WITH DIFFERENTIAL/PLATELET
Basophils Absolute: 0 10*3/uL (ref 0.0–0.1)
Basophils Relative: 0.4 % (ref 0.0–3.0)
Eosinophils Absolute: 0.1 10*3/uL (ref 0.0–0.7)
Eosinophils Relative: 0.8 % (ref 0.0–5.0)
HCT: 45.3 % (ref 39.0–52.0)
Hemoglobin: 15.4 g/dL (ref 13.0–17.0)
Lymphocytes Relative: 29.8 % (ref 12.0–46.0)
Lymphs Abs: 2.2 10*3/uL (ref 0.7–4.0)
MCHC: 34.1 g/dL (ref 30.0–36.0)
MCV: 88.3 fl (ref 78.0–100.0)
Monocytes Absolute: 0.4 10*3/uL (ref 0.1–1.0)
Monocytes Relative: 5.7 % (ref 3.0–12.0)
Neutro Abs: 4.7 10*3/uL (ref 1.4–7.7)
Neutrophils Relative %: 63.3 % (ref 43.0–77.0)
Platelets: 186 10*3/uL (ref 150.0–400.0)
RBC: 5.13 Mil/uL (ref 4.22–5.81)
RDW: 14.6 % (ref 11.5–15.5)
WBC: 7.4 10*3/uL (ref 4.0–10.5)

## 2017-08-02 LAB — LIPID PANEL
Cholesterol: 199 mg/dL (ref 0–200)
HDL: 28.3 mg/dL — ABNORMAL LOW (ref >39.00)
LDL Cholesterol: 139 mg/dL — ABNORMAL HIGH (ref 0–99)
NonHDL: 170.42
Total CHOL/HDL Ratio: 7
Triglycerides: 159 mg/dL — ABNORMAL HIGH (ref 0.0–149.0)
VLDL: 31.8 mg/dL (ref 0.0–40.0)

## 2017-08-02 LAB — T4, FREE: Free T4: 0.78 ng/dL (ref 0.60–1.60)

## 2017-08-02 LAB — TSH: TSH: 2.17 u[IU]/mL (ref 0.35–4.50)

## 2017-08-02 LAB — T3, FREE: T3, Free: 3.6 pg/mL (ref 2.3–4.2)

## 2017-08-02 LAB — PSA: PSA: 0.79 ng/mL (ref 0.10–4.00)

## 2017-08-05 ENCOUNTER — Other Ambulatory Visit (INDEPENDENT_AMBULATORY_CARE_PROVIDER_SITE_OTHER): Payer: BLUE CROSS/BLUE SHIELD

## 2017-08-05 DIAGNOSIS — R7303 Prediabetes: Secondary | ICD-10-CM | POA: Diagnosis not present

## 2017-08-05 LAB — HEMOGLOBIN A1C: Hgb A1c MFr Bld: 7.5 % — ABNORMAL HIGH (ref 4.6–6.5)

## 2017-08-13 NOTE — Progress Notes (Signed)
Dr. Frederico Hamman T. Jaydrien Wassenaar, MD, [facility] Primary Care and Sports Medicine [place], [address] Phone: [phone] Fax: [phone]  08/14/2017  Patient: Alex Newman, MRN: [id number], DOB: Apr 15, 1970, 47 y.o.  Primary Physician:  Owens Loffler, MD   CC: New onset DM  Subjective:   Alex Newman is a 47 y.o. very pleasant male patient who presents with the following:  He is a well-known patient, but he has not been into the office in some time.  He has not seen me in approximately 2 years.  I recently did some blood work on him, and his blood sugar came back high, and his A1c also came back significantly elevated, and this is a visit to follow-up regarding new onset diabetes mellitus type 2.  Lab Results  Component Value Date   HGBA1C 7.5 (H) 08/05/2017    Discouraged about his weight loss.   DM - new onset DM. No symptoms at all.  Wt Readings from Last 3 Encounters:  08/14/17 (!) 350 lb 8 oz (159 kg)  03/13/16 (!) 353 lb 8 oz (160.3 kg)  10/19/15 (!) 346 lb 4 oz (157.1 kg)    Body mass index is 49.58 kg/m.   Change to synthroid. Increase price from armour thyroid  Thyroid: No symptoms. Labs reviewed. Denies cold / heat intolerance, dry skin, hair loss. No goiter.  Lab Results  Component Value Date   TSH 2.17 08/02/2017     Past Medical History, Surgical History, Social History, Family History, Problem List, Medications, and Allergies have been reviewed and updated if relevant.  Patient Active Problem List   Diagnosis Date Noted  . GERD (gastroesophageal reflux disease) 02/11/2013  . MORBID OBESITY 05/18/2010  . BACK PAIN, LUMBAR 09/29/2007  . HYPERCHOLESTEROLEMIA 07/10/2007  . ALLERGIC RHINITIS 07/10/2007  . RENAL CALCULUS 07/10/2007  . HYPOTHYROIDISM 07/10/2007    Past Medical History:  Diagnosis Date  . Allergic rhinitis due to pollen   . GERD (gastroesophageal reflux disease) 02/11/2013  . Hyperlipidemia   .  Hypothyroidism   . Morbid obesity with BMI of 45.0-49.9, adult ([place])   . Nephrolithiasis     Past Surgical History:  Procedure Laterality Date  . GANGLION CYST EXCISION Right    wrist x 2  . kidney stone removal     x2    Social History   Socioeconomic History  . Marital status: Married    Spouse name: Not on file  . Number of children: 2  . Years of education: Not on file  . Highest education level: Not on file  Occupational History  . Occupation: Photographer: [employer]  Social Needs  . Financial resource strain: Not on file  . Food insecurity:    Worry: Not on file    Inability: Not on file  . Transportation needs:    Medical: Not on file    Non-medical: Not on file  Tobacco Use  . Smoking status: Never Smoker  . Smokeless tobacco: Never Used  Substance and Sexual Activity  . Alcohol use: No    Alcohol/week: 0.0 oz  . Drug use: No  . Sexual activity: Yes    Partners: Female  Lifestyle  . Physical activity:    Days per week: Not on file    Minutes per session: Not on file  . Stress: Not on file  Relationships  . Social connections:    Talks on phone: Not on file  Gets together: Not on file    Attends religious service: Not on file    Active member of club or organization: Not on file    Attends meetings of clubs or organizations: Not on file    Relationship status: Not on file  . Intimate partner violence:    Fear of current or ex partner: Not on file    Emotionally abused: Not on file    Physically abused: Not on file    Forced sexual activity: Not on file  Other Topics Concern  . Not on file  Social History Narrative  . Not on file    Family History  Problem Relation Age of Onset  . Thyroid disease Mother   . Bipolar disorder Mother   . Heart disease Father   . Neuropathy Father   . Stroke Maternal Grandfather   . Lung cancer Maternal Grandmother     Allergies  Allergen Reactions  . Cetirizine Hcl      REACTION: Headache  . Penicillins   . Propoxyphene N-Acetaminophen     REACTION: ha, nausea    Medication list reviewed and updated in full in [facility].   GEN: No acute illnesses, no fevers, chills. GI: No n/v/d, eating normally Pulm: No SOB Interactive and getting along well at home.  Otherwise, ROS is as per the HPI.  Objective:   BP 120/80   Pulse 85   Temp 97.8 F (36.6 C) (Oral)   Ht 5' 10.5" (1.791 m)   Wt (!) 350 lb 8 oz (159 kg)   BMI 49.58 kg/m   GEN: WDWN, NAD, Non-toxic, A & O x 3 HEENT: Atraumatic, Normocephalic. Neck supple. No masses, No LAD. Ears and Nose: No external deformity. CV: RRR, No M/G/R. No JVD. No thrill. No extra heart sounds. PULM: CTA B, no wheezes, crackles, rhonchi. No retractions. No resp. distress. No accessory muscle use. EXTR: No c/c/e NEURO Normal gait.  PSYCH: Normally interactive. Conversant. Not depressed or anxious appearing.  Calm demeanor.   Laboratory and Imaging Data: Lab Results  Component Value Date   WBC 7.4 08/02/2017   HGB 15.4 08/02/2017   HCT 45.3 08/02/2017   PLT 186.0 08/02/2017   GLUCOSE 168 (H) 08/02/2017   CHOL 199 08/02/2017   TRIG 159.0 (H) 08/02/2017   HDL 28.30 (L) 08/02/2017   LDLDIRECT 177.1 11/05/2012   LDLCALC 139 (H) 08/02/2017   ALT 42 08/02/2017   AST 37 08/02/2017   NA 141 08/02/2017   K 4.3 08/02/2017   CL 104 08/02/2017   CREATININE 0.84 08/02/2017   BUN 10 08/02/2017   CO2 27 08/02/2017   TSH 2.17 08/02/2017   PSA 0.79 08/02/2017   HGBA1C 7.5 (H) 08/05/2017     Assessment and Plan:   Diabetes mellitus without complication ([place]) - Plan: Blood Glucose Monitoring Suppl (ACCU-CHEK AVIVA PLUS) w/Device KIT, glucose blood (ACCU-CHEK AVIVA) test strip, Lancets Misc. (ACCU-CHEK FASTCLIX LANCET) KIT, ACCU-CHEK FASTCLIX LANCETS MISC  Elevated blood sugar - Plan: POCT glucose (manual entry)  Hypothyroidism, adult  Morbid obesity with body mass index of 45.0-49.9 in adult  [facility])  New onset DM discussion Start Metformin Work on weight loss  Change to synthroid and recheck  Follow-up: Return in about 3 months (around 11/14/2017) for Complete physical .  Meds ordered this encounter  Medications  . metFORMIN (GLUCOPHAGE-XR) 500 MG 24 hr tablet    Sig: Take 1 tablet (500 mg total) by mouth daily with breakfast.    Dispense:  30 tablet    Refill:  3  . levothyroxine (SYNTHROID, LEVOTHROID) 75 MCG tablet    Sig: Take 1 tablet (75 mcg total) by mouth daily.    Dispense:  90 tablet    Refill:  3  . Blood Glucose Monitoring Suppl (ACCU-CHEK AVIVA PLUS) w/Device KIT    Sig: Use to check blood sugar two times a day    Dispense:  1 kit    Refill:  0  . glucose blood (ACCU-CHEK AVIVA) test strip    Sig: Use to check blood sugar two times a day    Dispense:  100 each    Refill:  11  . Lancets Misc. (ACCU-CHEK FASTCLIX LANCET) KIT    Sig: Use to check blood sugar two times a day    Dispense:  1 kit    Refill:  0  . ACCU-CHEK FASTCLIX LANCETS MISC    Sig: Use to check blood sugar two times a day    Dispense:  102 each    Refill:  11   Orders Placed This Encounter  Procedures  . POCT glucose (manual entry)    Signed,  Lutisha Knoche T. Evaline Waltman, MD   Allergies as of 08/14/2017      Reactions   Cetirizine Hcl    REACTION: Headache   Penicillins    Propoxyphene N-acetaminophen    REACTION: ha, nausea      Medication List        Accurate as of 08/14/17 11:59 PM. Always use your most recent med list.          ACCU-CHEK AVIVA PLUS w/Device Kit Use to check blood sugar two times a day   ACCU-CHEK FASTCLIX LANCET Kit Use to check blood sugar two times a day   ACCU-CHEK FASTCLIX LANCETS Misc Use to check blood sugar two times a day   glucose blood test strip Commonly known as:  ACCU-CHEK AVIVA Use to check blood sugar two times a day   levothyroxine 75 MCG tablet Commonly known as:  SYNTHROID, LEVOTHROID Take 1 tablet (75 mcg total) by mouth  daily.   metFORMIN 500 MG 24 hr tablet Commonly known as:  GLUCOPHAGE-XR Take 1 tablet (500 mg total) by mouth daily with breakfast.   ondansetron 8 MG disintegrating tablet Commonly known as:  ZOFRAN-ODT DISSOLVE ONE TABLET IN THE MOUTH EVERY 8HOURS AS NEEDED FOR NAUSEA AND VOMITING.   thyroid 15 MG tablet Commonly known as:  ARMOUR THYROID Take 3 tablets (45 mg total) by mouth daily.

## 2017-08-14 ENCOUNTER — Ambulatory Visit (INDEPENDENT_AMBULATORY_CARE_PROVIDER_SITE_OTHER): Payer: BLUE CROSS/BLUE SHIELD | Admitting: Family Medicine

## 2017-08-14 ENCOUNTER — Encounter: Payer: Self-pay | Admitting: Family Medicine

## 2017-08-14 VITALS — BP 120/80 | HR 85 | Temp 97.8°F | Ht 70.5 in | Wt 350.5 lb

## 2017-08-14 DIAGNOSIS — E119 Type 2 diabetes mellitus without complications: Secondary | ICD-10-CM

## 2017-08-14 DIAGNOSIS — Z6841 Body Mass Index (BMI) 40.0 and over, adult: Secondary | ICD-10-CM | POA: Diagnosis not present

## 2017-08-14 DIAGNOSIS — E039 Hypothyroidism, unspecified: Secondary | ICD-10-CM | POA: Diagnosis not present

## 2017-08-14 DIAGNOSIS — R739 Hyperglycemia, unspecified: Secondary | ICD-10-CM

## 2017-08-14 LAB — GLUCOSE, POCT (MANUAL RESULT ENTRY): POC Glucose: 152 mg/dl — AB (ref 70–99)

## 2017-08-14 MED ORDER — GLUCOSE BLOOD VI STRP: ORAL_STRIP | 11 refills | Status: DC

## 2017-08-14 MED ORDER — ACCU-CHEK AVIVA PLUS W/DEVICE KIT
PACK | 0 refills | Status: DC
Start: 2017-08-14 — End: 2021-03-02

## 2017-08-14 MED ORDER — LEVOTHYROXINE SODIUM 75 MCG PO TABS: 75.0000 ug | ORAL_TABLET | Freq: Every day | ORAL | 3 refills | Status: DC

## 2017-08-14 MED ORDER — ACCU-CHEK FASTCLIX LANCET KIT: PACK | 0 refills | Status: DC

## 2017-08-14 MED ORDER — METFORMIN HCL ER 500 MG PO TB24: 500.0000 mg | ORAL_TABLET | Freq: Every day | ORAL | 3 refills | Status: DC

## 2017-08-14 MED ORDER — ACCU-CHEK FASTCLIX LANCETS MISC
11 refills | Status: DC
Start: 2017-08-14 — End: 2019-01-26

## 2017-08-14 NOTE — Patient Instructions (Signed)
American Diabetes Association - go to their website.

## 2017-08-27 ENCOUNTER — Telehealth: Payer: Self-pay | Admitting: Family Medicine

## 2017-08-27 NOTE — Telephone Encounter (Signed)
Copied from CRM [id number]. Topic: Quick Communication - See Telephone Encounter >> Aug 27, 2017  2:18 PM Cipriano BunkerLambe, Annette S wrote: CRM for notification. See Telephone encounter for: 08/27/17.  Pt  called saying that since he started metformin on 6/12 and the dr. changed  him to levothyroxine (SYNTHROID, LEVOTHROID) 75 MCG tablet  He has been very tired and lots of body aches.  Been slightly lightheaded off and on the last 3 days   Please call and let him know what he should do

## 2017-08-28 NOTE — Telephone Encounter (Signed)
I spoke with pt; pt said for last 3 - 4 days pt feels like he has taken a strong muscle relaxant (pt did not take a muscle relaxant)that has caused him this degree of fatigue; pt comes home from work and sits down. The fatigue is non stop but the lightheadedness which makes it difficult to concentrate at work occurs 2 - 3 times per day and last about 10'. There is no Abd pain, H/A, SOB or vision changes. Pt has been taking meds about 1 1/2 weeks. FBS have consistently been 130 - 140 before pt takes the metformin. Pt cks BS at 6:00 - 6:30 pm daily before supper and averages 91 - 92. [facility] [place] . Pt request cb after Dr Patsy Lageropland reviews.

## 2017-08-28 NOTE — Telephone Encounter (Signed)
Left message for pt to call back to discuss symptoms.

## 2017-08-28 NOTE — Telephone Encounter (Signed)
Called and LMOM  Please call  a1c 7.5 Have him stop his metformin. It will not hurt him to stop it  - he could be getting hypoglycemia  He could also be sick

## 2017-08-28 NOTE — Telephone Encounter (Signed)
Need more info  Unlikely related to these meds. He should check BS to get some readings where his numbers are.  Possibly sick?

## 2017-08-28 NOTE — Telephone Encounter (Signed)
Left message for Alex Newman to call back to speak with triage nurse.  Need more information about his symptoms.  Per Dr. Patsy Lageropland he does not think these medications are causing his symptoms.  Dr. Patsy Lageropland would also like to get some blood sugar readings to see where his numbers are running.  Any recent tick bites? [facility] to speak with patient when he calls back to get more detail information.

## 2017-08-29 NOTE — Telephone Encounter (Signed)
Gery PrayBarry notified as instructed by telephone.

## 2018-03-25 ENCOUNTER — Encounter: Payer: Self-pay | Admitting: Emergency Medicine

## 2018-03-25 ENCOUNTER — Emergency Department: Payer: BLUE CROSS/BLUE SHIELD

## 2018-03-25 ENCOUNTER — Emergency Department
Admission: EM | Admit: 2018-03-25 | Discharge: 2018-03-25 | Disposition: A | Discharge status: Home / Self Care | Payer: BLUE CROSS/BLUE SHIELD | Attending: Student in an Organized Health Care Education/Training Program | Admitting: Student in an Organized Health Care Education/Training Program

## 2018-03-25 ENCOUNTER — Other Ambulatory Visit: Payer: Self-pay

## 2018-03-25 DIAGNOSIS — Z7984 Long term (current) use of oral hypoglycemic drugs: Secondary | ICD-10-CM | POA: Insufficient documentation

## 2018-03-25 DIAGNOSIS — N132 Hydronephrosis with renal and ureteral calculous obstruction: Secondary | ICD-10-CM | POA: Diagnosis not present

## 2018-03-25 DIAGNOSIS — Z79899 Other long term (current) drug therapy: Secondary | ICD-10-CM | POA: Insufficient documentation

## 2018-03-25 DIAGNOSIS — R1031 Right lower quadrant pain: Secondary | ICD-10-CM | POA: Diagnosis not present

## 2018-03-25 DIAGNOSIS — N3001 Acute cystitis with hematuria: Secondary | ICD-10-CM | POA: Insufficient documentation

## 2018-03-25 DIAGNOSIS — E039 Hypothyroidism, unspecified: Secondary | ICD-10-CM | POA: Diagnosis not present

## 2018-03-25 DIAGNOSIS — R109 Unspecified abdominal pain: Secondary | ICD-10-CM

## 2018-03-25 HISTORY — DX: Calculus of kidney: N20.0

## 2018-03-25 LAB — URINALYSIS, COMPLETE (UACMP) WITH MICROSCOPIC
Bilirubin Urine: NEGATIVE
Glucose, UA: NEGATIVE mg/dL
Ketones, ur: NEGATIVE mg/dL
Nitrite: POSITIVE — AB
Protein, ur: 30 mg/dL — AB
RBC / HPF: >50 RBC/hpf — ABNORMAL HIGH (ref 0–5)
Specific Gravity, Urine: 1.013 (ref 1.005–1.030)
Squamous Epithelial / HPF: NONE SEEN (ref 0–5)
WBC, UA: >50 WBC/hpf — ABNORMAL HIGH (ref 0–5)
pH: 6 (ref 5.0–8.0)

## 2018-03-25 LAB — COMPREHENSIVE METABOLIC PANEL
ALT: 43 U/L (ref 0–44)
AST: 41 U/L (ref 15–41)
Albumin: 4.1 g/dL (ref 3.5–5.0)
Alkaline Phosphatase: 118 U/L (ref 38–126)
Anion gap: 9 (ref 5–15)
BUN: 12 mg/dL (ref 6–20)
CO2: 23 mmol/L (ref 22–32)
Calcium: 9.5 mg/dL (ref 8.9–10.3)
Chloride: 107 mmol/L (ref 98–111)
Creatinine, Ser: 0.86 mg/dL (ref 0.61–1.24)
GFR calc Af Amer: >60 mL/min (ref >60)
GFR calc non Af Amer: >60 mL/min (ref >60)
Glucose, Bld: 156 mg/dL — ABNORMAL HIGH (ref 70–99)
Potassium: 3.9 mmol/L (ref 3.5–5.1)
Sodium: 139 mmol/L (ref 135–145)
Total Bilirubin: 1 mg/dL (ref 0.3–1.2)
Total Protein: 8.2 g/dL — ABNORMAL HIGH (ref 6.5–8.1)

## 2018-03-25 LAB — CBC
HCT: 49.2 % (ref 39.0–52.0)
Hemoglobin: 16.3 g/dL (ref 13.0–17.0)
MCH: 29.3 pg (ref 26.0–34.0)
MCHC: 33.1 g/dL (ref 30.0–36.0)
MCV: 88.5 fL (ref 80.0–100.0)
Platelets: 213 10*3/uL (ref 150–400)
RBC: 5.56 MIL/uL (ref 4.22–5.81)
RDW: 13.7 % (ref 11.5–15.5)
WBC: 8.8 10*3/uL (ref 4.0–10.5)
nRBC: 0 % (ref 0.0–0.2)

## 2018-03-25 MED ORDER — ONDANSETRON HCL 4 MG/2ML IJ SOLN
4.0000 mg | Freq: Once | INTRAMUSCULAR | Status: AC
  Administered 2018-03-25: 4 mg via INTRAVENOUS
  Filled 2018-03-25: qty 2

## 2018-03-25 MED ORDER — ONDANSETRON HCL 4 MG/2ML IJ SOLN
4.0000 mg | Freq: Once | INTRAMUSCULAR | Status: AC | PRN
  Administered 2018-03-25: 4 mg via INTRAVENOUS
  Filled 2018-03-25 (×2): qty 2

## 2018-03-25 MED ORDER — OXYCODONE-ACETAMINOPHEN 5-325 MG PO TABS
1.0000 | ORAL_TABLET | Freq: Once | ORAL | Status: AC
  Administered 2018-03-25: 1 via ORAL
  Filled 2018-03-25: qty 1

## 2018-03-25 MED ORDER — HYDROCODONE-ACETAMINOPHEN 5-325 MG PO TABS: 1.0000 | ORAL_TABLET | ORAL | 0 refills | Status: DC | PRN

## 2018-03-25 MED ORDER — FENTANYL CITRATE (PF) 100 MCG/2ML IJ SOLN
50.0000 ug | INTRAMUSCULAR | Status: DC | PRN
  Administered 2018-03-25: 50 ug via INTRAVENOUS
  Filled 2018-03-25: qty 2

## 2018-03-25 MED ORDER — CEPHALEXIN 500 MG PO CAPS: 500.0000 mg | ORAL_CAPSULE | Freq: Three times a day (TID) | ORAL | 0 refills | Status: AC

## 2018-03-25 MED ORDER — MORPHINE SULFATE (PF) 4 MG/ML IV SOLN
4.0000 mg | INTRAVENOUS | Status: DC | PRN
  Administered 2018-03-25: 4 mg via INTRAVENOUS
  Filled 2018-03-25: qty 1

## 2018-03-25 MED ORDER — POLYETHYLENE GLYCOL 3350 17 G PO PACK: 17.0000 g | PACK | Freq: Every day | ORAL | 0 refills | Status: DC

## 2018-03-25 MED ORDER — KETOROLAC TROMETHAMINE 30 MG/ML IJ SOLN
15.0000 mg | Freq: Once | INTRAMUSCULAR | Status: AC
  Administered 2018-03-25: 15 mg via INTRAVENOUS
  Filled 2018-03-25: qty 1

## 2018-03-25 MED ORDER — SODIUM CHLORIDE 0.9 % IV SOLN
1.0000 g | Freq: Once | INTRAVENOUS | Status: AC
  Administered 2018-03-25: 1 g via INTRAVENOUS
  Filled 2018-03-25: qty 10

## 2018-03-25 MED ORDER — SODIUM CHLORIDE 0.9% FLUSH
3.0000 mL | Freq: Once | INTRAVENOUS | Status: AC
  Administered 2018-03-25: 3 mL via INTRAVENOUS

## 2018-03-25 NOTE — ED Provider Notes (Signed)
[facility] Emergency Department Provider Note    First MD Initiated Contact with Patient 03/25/18 [phone]     (approximate)  I have reviewed the triage vital signs and the nursing notes.   HISTORY  Chief Complaint Flank Pain    HPI Alex Newman is a 48 y.o. male history of kidney stones below listed past medical history presents for evaluation of right flank pain.  States it woke him up around 715.  States is more severe and feels different from previous.  Is radiating to his right groin.  Does have his appendix.  Denies any fevers.  No dysuria.  No nausea or vomiting.  No chills.  No shortness of breath.    Past Medical History:  Diagnosis Date  . Allergic rhinitis due to pollen   . GERD (gastroesophageal reflux disease) 02/11/2013  . Hyperlipidemia   . Hypothyroidism   . Kidney stone   . Morbid obesity with BMI of 45.0-49.9, adult ([place])   . Nephrolithiasis    Family History  Problem Relation Age of Onset  . Thyroid disease Mother   . Bipolar disorder Mother   . Heart disease Father   . Neuropathy Father   . Stroke Maternal Grandfather   . Lung cancer Maternal Grandmother    Past Surgical History:  Procedure Laterality Date  . GANGLION CYST EXCISION Right    wrist x 2  . kidney stone removal     x2   Patient Active Problem List   Diagnosis Date Noted  . GERD (gastroesophageal reflux disease) 02/11/2013  . MORBID OBESITY 05/18/2010  . BACK PAIN, LUMBAR 09/29/2007  . HYPERCHOLESTEROLEMIA 07/10/2007  . ALLERGIC RHINITIS 07/10/2007  . RENAL CALCULUS 07/10/2007  . HYPOTHYROIDISM 07/10/2007      Prior to Admission medications   Medication Sig Start Date End Date Taking? Authorizing Provider  ACCU-CHEK FASTCLIX LANCETS MISC Use to check blood sugar two times a day 08/14/17   Copland, Frederico Hamman, MD  Blood Glucose Monitoring Suppl (ACCU-CHEK AVIVA PLUS) w/Device KIT Use to check blood sugar two times a day 08/14/17   Copland, Frederico Hamman, MD    glucose blood (ACCU-CHEK AVIVA) test strip Use to check blood sugar two times a day 08/14/17   Copland, Frederico Hamman, MD  Lancets Misc. (ACCU-CHEK FASTCLIX LANCET) KIT Use to check blood sugar two times a day 08/14/17   Copland, Frederico Hamman, MD  levothyroxine (SYNTHROID, LEVOTHROID) 75 MCG tablet Take 1 tablet (75 mcg total) by mouth daily. 08/14/17   Copland, Frederico Hamman, MD  metFORMIN (GLUCOPHAGE-XR) 500 MG 24 hr tablet Take 1 tablet (500 mg total) by mouth daily with breakfast. 08/14/17   Copland, Frederico Hamman, MD  ondansetron (ZOFRAN-ODT) 8 MG disintegrating tablet DISSOLVE ONE TABLET IN THE MOUTH EVERY 8HOURS AS NEEDED FOR NAUSEA AND VOMITING. 02/06/17   Copland, Frederico Hamman, MD  thyroid (ARMOUR THYROID) 15 MG tablet Take 3 tablets (45 mg total) by mouth daily. 05/31/17   CoplandFrederico Hamman, MD    Allergies Cetirizine hcl; Penicillins; and Propoxyphene n-acetaminophen    Social History Social History   Tobacco Use  . Smoking status: Never Smoker  . Smokeless tobacco: Never Used  Substance Use Topics  . Alcohol use: No    Alcohol/week: 0.0 standard drinks  . Drug use: No    Review of Systems Patient denies headaches, rhinorrhea, blurry vision, numbness, shortness of breath, chest pain, edema, cough, abdominal pain, nausea, vomiting, diarrhea, dysuria, fevers, rashes or hallucinations unless otherwise stated above in HPI. ____________________________________________   PHYSICAL  EXAM:  VITAL SIGNS: Vitals:   03/25/18 0837 03/25/18 0927  BP:  126/77  Pulse: 95   Resp:    Temp:    SpO2: 99% 97%    Constitutional: Alert and oriented.  Eyes: Conjunctivae are normal.  Head: Atraumatic. Nose: No congestion/rhinnorhea. Mouth/Throat: Mucous membranes are moist.   Neck: No stridor. Painless ROM.  Cardiovascular: Normal rate, regular rhythm. Grossly normal heart sounds.  Good peripheral circulation. Respiratory: Normal respiratory effort.  No retractions. Lungs CTAB. Gastrointestinal: Soft and  nontender. No distention. No abdominal bruits. + right CVA tenderness. Genitourinary:  Musculoskeletal: No lower extremity tenderness nor edema.  No joint effusions. Neurologic:  Normal speech and language. No gross focal neurologic deficits are appreciated. No facial droop Skin:  Skin is warm, dry and intact. No rash noted. Psychiatric: Mood and affect are normal. Speech and behavior are normal.  ____________________________________________   LABS (all labs ordered are listed, but only abnormal results are displayed)  Results for orders placed or performed during the hospital encounter of 03/25/18 (from the past 24 hour(s))  Comprehensive metabolic panel     Status: Abnormal   Collection Time: 03/25/18  8:50 AM  Result Value Ref Range   Sodium 139 135 - 145 mmol/L   Potassium 3.9 3.5 - 5.1 mmol/L   Chloride 107 98 - 111 mmol/L   CO2 23 22 - 32 mmol/L   Glucose, Bld 156 (H) 70 - 99 mg/dL   BUN 12 6 - 20 mg/dL   Creatinine, Ser 0.86 0.61 - 1.24 mg/dL   Calcium 9.5 8.9 - 10.3 mg/dL   Total Protein 8.2 (H) 6.5 - 8.1 g/dL   Albumin 4.1 3.5 - 5.0 g/dL   AST 41 15 - 41 U/L   ALT 43 0 - 44 U/L   Alkaline Phosphatase 118 38 - 126 U/L   Total Bilirubin 1.0 0.3 - 1.2 mg/dL   GFR calc non Af Amer >60 >60 mL/min   GFR calc Af Amer >60 >60 mL/min   Anion gap 9 5 - 15  CBC     Status: None   Collection Time: 03/25/18  8:50 AM  Result Value Ref Range   WBC 8.8 4.0 - 10.5 K/uL   RBC 5.56 4.22 - 5.81 MIL/uL   Hemoglobin 16.3 13.0 - 17.0 g/dL   HCT 49.2 39.0 - 52.0 %   MCV 88.5 80.0 - 100.0 fL   MCH 29.3 26.0 - 34.0 pg   MCHC 33.1 30.0 - 36.0 g/dL   RDW 13.7 11.5 - 15.5 %   Platelets 213 150 - 400 K/uL   nRBC 0.0 0.0 - 0.2 %   ____________________________________________  EK____________________________________________  RADIOLOGY  I personally reviewed all radiographic images ordered to evaluate for the above acute complaints and reviewed radiology reports and findings.  These  findings were personally discussed with the patient.  Please see medical record for radiology report.  ____________________________________________   PROCEDURES  Procedure(s) performed:  Procedures    Critical Care performed: no ____________________________________________   INITIAL IMPRESSION / ASSESSMENT AND PLAN / ED COURSE  Pertinent labs & imaging results that were available during my care of the patient were reviewed by me and considered in my medical decision making (see chart for details).   DDX: Stone, UTI, [place]o, AAA, appendicitis  Alex Newman is a 48 y.o. who presents to the ED with symptoms as described above.  CT imaging will be ordered to evaluate further by differential.  Will check urine.  Will provide pain medication.  Clinical Course as of Mar 25 1257  Tue Mar 25, 2018  1246 I discussed the case in consultation with Dr. Bernardo Heater for urology.  There is no evidence of stone or active obstruction on CT therefore will treat for acute cystitis and pyelonephritis and send urine cultures.   [PR]    Clinical Course User Index [PR] Merlyn Lot, MD     As part of my medical decision making, I reviewed the following data within the [facility] notes reviewed and incorporated, Labs reviewed, notes from prior ED visits.   ____________________________________________   FINAL CLINICAL IMPRESSION(S) / ED DIAGNOSES  Final diagnoses:  Right flank pain  Acute cystitis with hematuria      NEW MEDICATIONS STARTED DURING THIS VISIT:  New Prescriptions   No medications on file     Note:  This document was prepared using Dragon voice recognition software and may include unintentional dictation errors.    Merlyn Lot, MD 03/25/18 1530

## 2018-03-25 NOTE — Discharge Instructions (Addendum)

## 2018-03-25 NOTE — ED Notes (Signed)
Says med is kicking in and painis tolerable now.  Not distressed now.

## 2018-03-25 NOTE — ED Triage Notes (Signed)
sys right flank pain for about 1.5 hr. Says it is kidney stone as he has history of same.

## 2018-03-25 NOTE — ED Notes (Signed)
First Nurse Note: Patient to Rm 15 via WC, Kailey RN aware of room placement.

## 2018-03-27 LAB — URINE CULTURE: Culture: >=100000 — AB

## 2018-04-24 ENCOUNTER — Encounter: Payer: Self-pay | Admitting: Primary Care

## 2018-04-24 ENCOUNTER — Ambulatory Visit: Payer: BLUE CROSS/BLUE SHIELD | Admitting: Primary Care

## 2018-04-24 VITALS — BP 118/82 | HR 90 | Temp 98.0°F | Ht 71.0 in | Wt 352.5 lb

## 2018-04-24 DIAGNOSIS — R300 Dysuria: Secondary | ICD-10-CM

## 2018-04-24 DIAGNOSIS — N390 Urinary tract infection, site not specified: Secondary | ICD-10-CM

## 2018-04-24 LAB — BASIC METABOLIC PANEL
BUN: 14 mg/dL (ref 6–23)
CO2: 28 mEq/L (ref 19–32)
Calcium: 9.2 mg/dL (ref 8.4–10.5)
Chloride: 105 mEq/L (ref 96–112)
Creatinine, Ser: 1 mg/dL (ref 0.40–1.50)
GFR: 79.42 mL/min (ref >60.00)
Glucose, Bld: 161 mg/dL — ABNORMAL HIGH (ref 70–99)
Potassium: 4.2 mEq/L (ref 3.5–5.1)
Sodium: 141 mEq/L (ref 135–145)

## 2018-04-24 LAB — CBC WITH DIFFERENTIAL/PLATELET
Basophils Absolute: 0 10*3/uL (ref 0.0–0.1)
Basophils Relative: 0.4 % (ref 0.0–3.0)
Eosinophils Absolute: 0.1 10*3/uL (ref 0.0–0.7)
Eosinophils Relative: 0.8 % (ref 0.0–5.0)
HCT: 46.2 % (ref 39.0–52.0)
Hemoglobin: 15.9 g/dL (ref 13.0–17.0)
Lymphocytes Relative: 26.9 % (ref 12.0–46.0)
Lymphs Abs: 2.3 10*3/uL (ref 0.7–4.0)
MCHC: 34.4 g/dL (ref 30.0–36.0)
MCV: 88.2 fl (ref 78.0–100.0)
Monocytes Absolute: 0.5 10*3/uL (ref 0.1–1.0)
Monocytes Relative: 5.4 % (ref 3.0–12.0)
Neutro Abs: 5.8 10*3/uL (ref 1.4–7.7)
Neutrophils Relative %: 66.5 % (ref 43.0–77.0)
Platelets: 178 10*3/uL (ref 150.0–400.0)
RBC: 5.24 Mil/uL (ref 4.22–5.81)
RDW: 14.5 % (ref 11.5–15.5)
WBC: 8.7 10*3/uL (ref 4.0–10.5)

## 2018-04-24 LAB — POC URINALSYSI DIPSTICK (AUTOMATED)
Bilirubin, UA: NEGATIVE
Glucose, UA: NEGATIVE
Ketones, UA: NEGATIVE
Nitrite, UA: NEGATIVE
Protein, UA: NEGATIVE
Spec Grav, UA: 1.025 (ref 1.010–1.025)
Urobilinogen, UA: 0.2 E.U./dL
pH, UA: 6 (ref 5.0–8.0)

## 2018-04-24 MED ORDER — CIPROFLOXACIN HCL 500 MG PO TABS: 500.0000 mg | ORAL_TABLET | Freq: Two times a day (BID) | ORAL | 0 refills | Status: DC

## 2018-04-24 NOTE — Progress Notes (Signed)
Subjective:    Patient ID: Alex Newman, male    DOB: 03-29-1970, 48 y.o.   MRN: [id number]  HPI  Alex Newman is a 48 year old male with a history of hypothyroidism, renal calculus, lower back pain who presents today with a chief complaint of dysuria.  He was evaluated at [facility] ED on 03/25/18 for a chief complaint of flank pain. His pain woke him up several hours prior, pain radiating to right groin, felt more severe that prior episodes. During his stay in the ED he underwent CT renal stone study which showed "small non obstructing renal calculi bilaterally" also mild right sided hydronephrosis and proximal hydroureter which was thought to be secondary to edema from recently passed stone. UA with large leuks, moderate blood, positive nitrites so he was treated for UTI and discharged home with recommendations for Urology follow up. Culture returned with staph aureus.   Since his ED visit he completed his entire course of antibiotics and felt improved. He's not sure what he was prescribed. About two weeks ago he started experience pelvic pressure that is relieved with urination, urgency. He denies urinary frequency, difficulty urinating, hematuria, flank pain, fevers. He has an appointment with Urology in March 13th.   Review of Systems  Constitutional: Negative for fever.  Gastrointestinal: Negative for abdominal pain.  Genitourinary: Positive for urgency. Negative for discharge, dysuria, frequency, hematuria, penile pain and testicular pain.       Past Medical History:  Diagnosis Date  . Allergic rhinitis due to pollen   . GERD (gastroesophageal reflux disease) 02/11/2013  . Hyperlipidemia   . Hypothyroidism   . Kidney stone   . Morbid obesity with BMI of 45.0-49.9, adult ([place])   . Nephrolithiasis      Social History   Socioeconomic History  . Marital status: Married    Spouse name: Not on file  . Number of children: 2  . Years of education: Not on file  . Highest education  level: Not on file  Occupational History  . Occupation: Photographer: [employer]  Social Needs  . Financial resource strain: Not on file  . Food insecurity:    Worry: Not on file    Inability: Not on file  . Transportation needs:    Medical: Not on file    Non-medical: Not on file  Tobacco Use  . Smoking status: Never Smoker  . Smokeless tobacco: Never Used  Substance and Sexual Activity  . Alcohol use: No    Alcohol/week: 0.0 standard drinks  . Drug use: No  . Sexual activity: Yes    Partners: Female  Lifestyle  . Physical activity:    Days per week: Not on file    Minutes per session: Not on file  . Stress: Not on file  Relationships  . Social connections:    Talks on phone: Not on file    Gets together: Not on file    Attends religious service: Not on file    Active member of club or organization: Not on file    Attends meetings of clubs or organizations: Not on file    Relationship status: Not on file  . Intimate partner violence:    Fear of current or ex partner: Not on file    Emotionally abused: Not on file    Physically abused: Not on file    Forced sexual activity: Not on file  Other Topics Concern  . Not on file  Social History Narrative  . Not on file    Past Surgical History:  Procedure Laterality Date  . GANGLION CYST EXCISION Right    wrist x 2  . kidney stone removal     x2    Family History  Problem Relation Age of Onset  . Thyroid disease Mother   . Bipolar disorder Mother   . Heart disease Father   . Neuropathy Father   . Stroke Maternal Grandfather   . Lung cancer Maternal Grandmother     Allergies  Allergen Reactions  . Cetirizine Hcl     REACTION: Headache  . Penicillins   . Propoxyphene N-Acetaminophen     REACTION: ha, nausea    Current Outpatient Medications on File Prior to Visit  Medication Sig Dispense Refill  . ACCU-CHEK FASTCLIX LANCETS MISC Use to check blood sugar two times a day 102  each 11  . Blood Glucose Monitoring Suppl (ACCU-CHEK AVIVA PLUS) w/Device KIT Use to check blood sugar two times a day 1 kit 0  . glucose blood (ACCU-CHEK AVIVA) test strip Use to check blood sugar two times a day 100 each 11  . HYDROcodone-acetaminophen (NORCO) 5-325 MG tablet Take 1 tablet by mouth every 4 (four) hours as needed for moderate pain. 6 tablet 0  . Lancets Misc. (ACCU-CHEK FASTCLIX LANCET) KIT Use to check blood sugar two times a day 1 kit 0  . levothyroxine (SYNTHROID, LEVOTHROID) 75 MCG tablet Take 1 tablet (75 mcg total) by mouth daily. 90 tablet 3  . metFORMIN (GLUCOPHAGE-XR) 500 MG 24 hr tablet Take 1 tablet (500 mg total) by mouth daily with breakfast. 30 tablet 3  . ondansetron (ZOFRAN-ODT) 8 MG disintegrating tablet DISSOLVE ONE TABLET IN THE MOUTH EVERY 8HOURS AS NEEDED FOR NAUSEA AND VOMITING. (Patient taking differently: Take 8 mg by mouth every 8 (eight) hours as needed. ) 30 tablet 0  . polyethylene glycol (MIRALAX / GLYCOLAX) packet Take 17 g by mouth daily. Mix one tablespoon with 8oz of your favorite juice or water every day until you are having soft formed stools. Then start taking once daily if you didn't have a stool the day before. 30 each 0  . thyroid (ARMOUR THYROID) 15 MG tablet Take 3 tablets (45 mg total) by mouth daily. 270 tablet 0   No current facility-administered medications on file prior to visit.     BP 118/82   Pulse 90   Temp 98 F (36.7 C) (Oral)   Ht 5' 11"  (1.803 m)   Wt (!) 352 lb 8 oz (159.9 kg)   SpO2 98%   BMI 49.16 kg/m    Objective:   Physical Exam  Constitutional: He appears well-nourished.  Neck: Neck supple.  Cardiovascular: Normal rate and regular rhythm.  Respiratory: Effort normal and breath sounds normal.  GI: There is no CVA tenderness.  Genitourinary: Prostate is not enlarged and not tender.    Genitourinary Comments: Chaperone present   Skin: Skin is warm and dry.           Assessment & Plan:

## 2018-04-24 NOTE — Patient Instructions (Signed)
Start ciprofloxacin antibiotics for the infection. Take 1 tablet by mouth twice daily for 5 days.  Push intake of water to stay hydrated.  Follow up with Urology as scheduled.  Stop by the lab prior to leaving today. I will notify you of your results once received.   It was a pleasure meeting you!

## 2018-04-24 NOTE — Assessment & Plan Note (Signed)
Diagnosed originally in late January 2020, treated. UA today with 3+ leuks, trace blood. Culture sent. Prostate exam without obvious enlargement, doesn't seem to have acute prostatitis.  Check labs today including CBC and BMP. Rx for Cipro course sent to pharmacy. Push intake of water. He will follow up with Urology as scheduled.

## 2018-04-27 LAB — URINE CULTURE
MICRO NUMBER:: 221332
SPECIMEN QUALITY:: ADEQUATE

## 2018-05-15 ENCOUNTER — Other Ambulatory Visit: Payer: Self-pay

## 2018-05-15 ENCOUNTER — Encounter: Payer: Self-pay | Admitting: Urology

## 2018-05-15 ENCOUNTER — Ambulatory Visit: Payer: BLUE CROSS/BLUE SHIELD | Admitting: Urology

## 2018-05-15 VITALS — BP 129/81 | HR 90 | Ht 71.0 in | Wt 351.3 lb

## 2018-05-15 DIAGNOSIS — N390 Urinary tract infection, site not specified: Secondary | ICD-10-CM | POA: Diagnosis not present

## 2018-05-15 DIAGNOSIS — N1330 Unspecified hydronephrosis: Secondary | ICD-10-CM | POA: Diagnosis not present

## 2018-05-15 LAB — URINALYSIS, COMPLETE
Bilirubin, UA: NEGATIVE
Glucose, UA: NEGATIVE
Ketones, UA: NEGATIVE
Nitrite, UA: POSITIVE — AB
Protein, UA: NEGATIVE
Specific Gravity, UA: 1.02 (ref 1.005–1.030)
Urobilinogen, Ur: 0.2 mg/dL (ref 0.2–1.0)
pH, UA: 5.5 (ref 5.0–7.5)

## 2018-05-15 LAB — MICROSCOPIC EXAMINATION

## 2018-05-15 NOTE — Progress Notes (Signed)
05/15/2018  12:00 PM   Alex Newman 12-03-1970 [id number]  Referring provider: Owens Loffler, MD [address] [place], [place] [address]  Chief Complaint  Patient presents with  . Recurrent UTI   HPI: Alex Newman is a 48 yo M who presents today for the evaluation and management of rUTI.  -Presented to ED in January 2020 for flank pain  -No reoccurance of flank pain since ED visit  -Diagnosed with UTI and was given antibiotics in late January 2020 but still had burning sensation 3-4x weekly  -Finished course of antibiotics weeks ago  -UA today positive for RBCs, otherwise unremarkable  -CT revealed small nonobstructing renal calculi bilaterally and mild dilation of right ureter -He does not recall passing a stone   PMH: Past Medical History:  Diagnosis Date  . Allergic rhinitis due to pollen   . GERD (gastroesophageal reflux disease) 02/11/2013  . Hyperlipidemia   . Hypothyroidism   . Kidney stone   . Morbid obesity with BMI of 45.0-49.9, adult ([place])   . Nephrolithiasis     Surgical History: Past Surgical History:  Procedure Laterality Date  . GANGLION CYST EXCISION Right    wrist x 2  . kidney stone removal     x2    Home Medications:  Allergies as of 05/15/2018      Reactions   Cetirizine Hcl    REACTION: Headache   Penicillins    Propoxyphene N-acetaminophen    REACTION: ha, nausea      Medication List       Accurate as of May 15, 2018 12:00 PM. Always use your most recent med list.        Accu-Chek Aviva Plus w/Device Kit Use to check blood sugar two times a day   Accu-Chek [employer] Kit Use to check blood sugar two times a day   Accu-Chek FastClix Lancets Misc Use to check blood sugar two times a day   glucose blood test strip Commonly known as:  Accu-Chek Aviva Use to check blood sugar two times a day   levothyroxine 75 MCG tablet Commonly known as:  SYNTHROID, LEVOTHROID Take 1 tablet (75 mcg total) by mouth  daily.   metFORMIN 500 MG 24 hr tablet Commonly known as:  GLUCOPHAGE-XR Take 1 tablet (500 mg total) by mouth daily with breakfast.   ondansetron 8 MG disintegrating tablet Commonly known as:  ZOFRAN-ODT DISSOLVE ONE TABLET IN THE MOUTH EVERY 8HOURS AS NEEDED FOR NAUSEA AND VOMITING.   polyethylene glycol packet Commonly known as:  MIRALAX / GLYCOLAX Take 17 g by mouth daily. Mix one tablespoon with 8oz of your favorite juice or water every day until you are having soft formed stools. Then start taking once daily if you didn't have a stool the day before.   thyroid 15 MG tablet Commonly known as:  Armour Thyroid Take 3 tablets (45 mg total) by mouth daily.       Allergies:  Allergies  Allergen Reactions  . Cetirizine Hcl     REACTION: Headache  . Penicillins   . Propoxyphene N-Acetaminophen     REACTION: ha, nausea    Family History: Family History  Problem Relation Age of Onset  . Thyroid disease Mother   . Bipolar disorder Mother   . Heart disease Father   . Neuropathy Father   . Stroke Maternal Grandfather   . Lung cancer Maternal Grandmother     Social History:  reports that he has never smoked.  He has never used smokeless tobacco. He reports that he does not drink alcohol or use drugs.  ROS: UROLOGY Frequent Urination?: No Hard to postpone urination?: No Burning/pain with urination?: No Get up at night to urinate?: No Leakage of urine?: No Urine stream starts and stops?: No Trouble starting stream?: No Do you have to strain to urinate?: No Blood in urine?: No Urinary tract infection?: No Sexually transmitted disease?: No Injury to kidneys or bladder?: No Painful intercourse?: No Weak stream?: No Erection problems?: No Penile pain?: No  Gastrointestinal Nausea?: No Vomiting?: No Indigestion/heartburn?: No Diarrhea?: No Constipation?: No  Constitutional Fever: No Night sweats?: No Weight loss?: No Fatigue?: No  Skin Skin rash/lesions?:  No Itching?: No  Eyes Blurred vision?: No Double vision?: No  Ears/Nose/Throat Sore throat?: No Sinus problems?: No  Hematologic/Lymphatic Swollen glands?: No Easy bruising?: No  Cardiovascular Leg swelling?: No Chest pain?: No  Respiratory Cough?: No Shortness of breath?: No  Endocrine Excessive thirst?: No  Musculoskeletal Back pain?: Yes Joint pain?: Yes  Neurological Headaches?: No Dizziness?: No  Psychologic Depression?: No Anxiety?: No  Physical Exam: BP 129/81 (BP Location: Left Arm, Patient Position: Sitting, Cuff Size: Large)   Pulse 90   Ht 5' 11"  (1.803 m)   Wt (!) 351 lb 4.8 oz (159.3 kg)   BMI 49.00 kg/m   Constitutional:  Alert and oriented, No acute distress. HEENT: [place] AT, moist mucus membranes.  Trachea midline, no masses. Cardiovascular: No clubbing, cyanosis, or edema. Respiratory: Normal respiratory effort, no increased work of breathing. Skin: No rashes, bruises or suspicious lesions. Neurologic: Grossly intact, no focal deficits, moving all 4 extremities. Psychiatric: Normal mood and affect.  Laboratory Data:  Urinalysis  UA positive for RBCs, otherwise unremarkable.   Pertinent Imaging: CLINICAL DATA:  Right flank pain  EXAM: CT ABDOMEN AND PELVIS WITHOUT CONTRAST  TECHNIQUE: Multidetector CT imaging of the abdomen and pelvis was performed following the standard protocol without IV contrast.  COMPARISON:  None.  FINDINGS: Lower chest: No acute abnormality.  Hepatobiliary: No focal liver abnormality is seen. No gallstones, gallbladder wall thickening, or biliary dilatation.  Pancreas: Unremarkable. No pancreatic ductal dilatation or surrounding inflammatory changes.  Spleen: Normal in size without focal abnormality.  Adrenals/Urinary Tract: Adrenal glands are within normal limits. Scattered nonobstructing tiny renal calculi are noted bilaterally. There are mild obstructive changes identified on the right  extending into the distal right ureter. No definitive ureteral stone is seen. No calculus is noted within the bladder.  Stomach/Bowel: Stomach is within normal limits. Appendix appears normal. No evidence of bowel wall thickening, distention, or inflammatory changes.  Vascular/Lymphatic: Circumaortic left renal vein is noted. Mild atherosclerotic calcifications of the aorta are seen without aneurysmal dilatation. No significant lymphadenopathy is noted.  Reproductive: Prostate is unremarkable.  Other: No abdominal wall hernia or abnormality. No abdominopelvic ascites.  Musculoskeletal: No acute or significant osseous findings.  IMPRESSION: Small nonobstructing renal calculi bilaterally.  Mild right-sided hydronephrosis and proximal hydroureter. These changes may be related to edema from recently passed stone as no definitive obstructing lesion is seen.   Electronically Signed   By: Inez Catalina M.D.   On: 03/25/2018 10:21  I have personally reviewed the images and agree with radiologist interpretation.   Assessment & Plan:    1. Nephrolithiasis -UA today positive for RBCs otherwise unremarkable  -CT revealed small nonobstructing renal calculi bilaterally and mild dilation of right ureter -He does not recall passing a stone  -Schedule for RUS and will  call with results  -Urine culture sent - pending   Return for will schedule RUS and will call with results.  [facility] [address], [place], [place] [address] [phone]  I, Lucas Mallow, am acting as a scribe for Dr. Nicki Reaper C. Kimiko Common,  I, Abbie Sons, MD, have reviewed all documentation for this visit. The documentation on 05/18/18 for the exam, diagnosis, procedures, and orders are all accurate and complete.

## 2018-05-16 ENCOUNTER — Ambulatory Visit: Payer: BLUE CROSS/BLUE SHIELD | Admitting: Urology

## 2018-05-18 ENCOUNTER — Encounter: Payer: Self-pay | Admitting: Urology

## 2018-05-20 ENCOUNTER — Telehealth: Payer: Self-pay | Admitting: Urology

## 2018-05-20 DIAGNOSIS — R3129 Other microscopic hematuria: Secondary | ICD-10-CM

## 2018-05-20 DIAGNOSIS — N390 Urinary tract infection, site not specified: Secondary | ICD-10-CM

## 2018-05-20 LAB — CULTURE, URINE COMPREHENSIVE

## 2018-05-20 NOTE — Telephone Encounter (Signed)
Pt called and requests a call back for urine results.Please advise.

## 2018-05-20 NOTE — Telephone Encounter (Signed)
Pt has LMOM requesting a call back for UA culture results.

## 2018-05-21 NOTE — Addendum Note (Signed)
Addended by: Riki Altes on: 05/21/2018 03:13 PM   Modules accepted: Orders

## 2018-05-21 NOTE — Telephone Encounter (Signed)
Called pt he states that he has severe urgency as well as pressure. Pt also questions if there are any other options vs. CT urogram as his out of pocket for CT is $2,000 and he just had one in January. Advised pt that I would ask, he states that if absolutely neccessary he will proceed with having it done.

## 2018-05-21 NOTE — Telephone Encounter (Signed)
Urine culture was positive for staph and strep.  Please contact patient to see if he is having any symptoms.  Since he still has bacteria in the urine would recommend scheduling a CT urogram.  If he gets contacted to schedule the renal ultrasound he does not need to schedule but he should schedule the CT

## 2018-05-22 NOTE — Telephone Encounter (Signed)
Since he had obstructive changes in the right ureter on his noncontrast CT and still has bacteriuria I feel that a CT urogram is going to be the best test and that a renal ultrasound will not provide any valuable information.  It is likely if he had the ultrasound I would then recommend the CT urogram.

## 2018-05-23 ENCOUNTER — Telehealth: Payer: Self-pay | Admitting: Urology

## 2018-05-23 MED ORDER — SULFAMETHOXAZOLE-TRIMETHOPRIM 800-160 MG PO TABS: 1.0000 | ORAL_TABLET | Freq: Two times a day (BID) | ORAL | 0 refills | Status: AC

## 2018-05-23 NOTE — Telephone Encounter (Signed)
Antibiotic Rx was sent

## 2018-05-23 NOTE — Telephone Encounter (Signed)
Spoke with patient, informed as instructed. Patient verbalized understanding.

## 2018-05-23 NOTE — Telephone Encounter (Signed)
Patient called back and stated that he has not heard anything back, he thought we were calling him in an ABX for his staph infection? He said he would do the CT scan if he had to but he felt like he needed an ABX for this infection because he was worried about how dangerous they are. Please have someone from the clinical staff call him back please   Marcelino Duster

## 2018-05-27 NOTE — Telephone Encounter (Signed)
See my chart message

## 2018-06-19 ENCOUNTER — Other Ambulatory Visit: Payer: Self-pay

## 2018-06-19 ENCOUNTER — Telehealth: Payer: Self-pay | Admitting: Urology

## 2018-06-19 ENCOUNTER — Other Ambulatory Visit: Payer: BLUE CROSS/BLUE SHIELD

## 2018-06-19 DIAGNOSIS — N390 Urinary tract infection, site not specified: Secondary | ICD-10-CM

## 2018-06-19 NOTE — Telephone Encounter (Signed)
Spoke with Dr. Lonna Cobb and he wants the patient to come in to do a UA and Culture today or next week to make sure his infection has cleared after finishing his ABX.  I called the patient but had to leave a message for him to CB to schedule this app.   Marcelino Duster

## 2018-06-19 NOTE — Telephone Encounter (Signed)
Pt called, appt made for this afternoon.

## 2018-06-20 LAB — URINALYSIS, COMPLETE
Bilirubin, UA: NEGATIVE
Ketones, UA: NEGATIVE
Leukocytes,UA: NEGATIVE
Nitrite, UA: NEGATIVE
Protein,UA: NEGATIVE
Specific Gravity, UA: >1.03 — ABNORMAL HIGH (ref 1.005–1.030)
Urobilinogen, Ur: 0.2 mg/dL (ref 0.2–1.0)
pH, UA: 5.5 (ref 5.0–7.5)

## 2018-06-20 LAB — MICROSCOPIC EXAMINATION: Bacteria, UA: NONE SEEN

## 2018-07-02 ENCOUNTER — Ambulatory Visit: Payer: BLUE CROSS/BLUE SHIELD

## 2018-07-24 ENCOUNTER — Telehealth: Payer: Self-pay

## 2018-07-24 NOTE — Telephone Encounter (Signed)
Pt left v/m requesting cb has questions about metformin. Left v/m requesting pt to cb.

## 2018-07-25 NOTE — Telephone Encounter (Signed)
Spoke with [name].  He states he changed his diet and was eating a lot of fruit which raised his blood sugar.  He has not been taking his Metformin now for a couple of months because he felt his blood sugars have been running good until he started eating lots of fruit.  Now they are running around 250.  He states he spoke with his pharmacist and they advised him he could take his Metformin one tablet twice a day, which he started doing yesterday, to help bring his blood sugar back down.  He plans on doing this for a couple of days.  He has not been seen since June of 2019 so I have ask Alex Newman to call and schedule CPE with fasting labs for June with Dr. Patsy Newman.  I also advised Alex Newman to cut back on the fruit because it is high in sugar.  Patient is aware that Alex Newman will be calling him to set up his physical.

## 2018-08-17 ENCOUNTER — Other Ambulatory Visit: Payer: Self-pay | Admitting: Family Medicine

## 2018-08-23 ENCOUNTER — Other Ambulatory Visit: Payer: Self-pay | Admitting: Family Medicine

## 2018-08-23 DIAGNOSIS — E119 Type 2 diabetes mellitus without complications: Secondary | ICD-10-CM

## 2018-09-09 ENCOUNTER — Other Ambulatory Visit: Payer: Self-pay

## 2018-09-09 ENCOUNTER — Other Ambulatory Visit (INDEPENDENT_AMBULATORY_CARE_PROVIDER_SITE_OTHER): Payer: BC Managed Care – PPO

## 2018-09-09 DIAGNOSIS — Z0000 Encounter for general adult medical examination without abnormal findings: Secondary | ICD-10-CM

## 2018-09-09 DIAGNOSIS — E119 Type 2 diabetes mellitus without complications: Secondary | ICD-10-CM

## 2018-09-09 DIAGNOSIS — Z125 Encounter for screening for malignant neoplasm of prostate: Secondary | ICD-10-CM | POA: Diagnosis not present

## 2018-09-09 DIAGNOSIS — E039 Hypothyroidism, unspecified: Secondary | ICD-10-CM

## 2018-09-09 LAB — HEPATIC FUNCTION PANEL
ALT: 37 U/L (ref 0–53)
AST: 38 U/L — ABNORMAL HIGH (ref 0–37)
Albumin: 4.2 g/dL (ref 3.5–5.2)
Alkaline Phosphatase: 114 U/L (ref 39–117)
Bilirubin, Direct: 0.2 mg/dL (ref 0.0–0.3)
Total Bilirubin: 1 mg/dL (ref 0.2–1.2)
Total Protein: 7 g/dL (ref 6.0–8.3)

## 2018-09-09 LAB — MICROALBUMIN / CREATININE URINE RATIO
Creatinine,U: 147.2 mg/dL
Microalb Creat Ratio: 2.9 mg/g (ref 0.0–30.0)
Microalb, Ur: 4.3 mg/dL — ABNORMAL HIGH (ref 0.0–1.9)

## 2018-09-09 LAB — CBC WITH DIFFERENTIAL/PLATELET
Basophils Absolute: 0 10*3/uL (ref 0.0–0.1)
Basophils Relative: 0.3 % (ref 0.0–3.0)
Eosinophils Absolute: 0 10*3/uL (ref 0.0–0.7)
Eosinophils Relative: 0.6 % (ref 0.0–5.0)
HCT: 47 % (ref 39.0–52.0)
Hemoglobin: 15.8 g/dL (ref 13.0–17.0)
Lymphocytes Relative: 28.3 % (ref 12.0–46.0)
Lymphs Abs: 2.1 10*3/uL (ref 0.7–4.0)
MCHC: 33.7 g/dL (ref 30.0–36.0)
MCV: 89.3 fl (ref 78.0–100.0)
Monocytes Absolute: 0.4 10*3/uL (ref 0.1–1.0)
Monocytes Relative: 5.5 % (ref 3.0–12.0)
Neutro Abs: 5 10*3/uL (ref 1.4–7.7)
Neutrophils Relative %: 65.3 % (ref 43.0–77.0)
Platelets: 189 10*3/uL (ref 150.0–400.0)
RBC: 5.27 Mil/uL (ref 4.22–5.81)
RDW: 14.4 % (ref 11.5–15.5)
WBC: 7.6 10*3/uL (ref 4.0–10.5)

## 2018-09-09 LAB — BASIC METABOLIC PANEL
BUN: 11 mg/dL (ref 6–23)
CO2: 25 mEq/L (ref 19–32)
Calcium: 9.3 mg/dL (ref 8.4–10.5)
Chloride: 102 mEq/L (ref 96–112)
Creatinine, Ser: 0.9 mg/dL (ref 0.40–1.50)
GFR: 89.55 mL/min (ref >60.00)
Glucose, Bld: 173 mg/dL — ABNORMAL HIGH (ref 70–99)
Potassium: 4.1 mEq/L (ref 3.5–5.1)
Sodium: 139 mEq/L (ref 135–145)

## 2018-09-09 LAB — T4, FREE: Free T4: 0.86 ng/dL (ref 0.60–1.60)

## 2018-09-09 LAB — LDL CHOLESTEROL, DIRECT: Direct LDL: 143 mg/dL

## 2018-09-09 LAB — LIPID PANEL
Cholesterol: 198 mg/dL (ref 0–200)
HDL: 30.3 mg/dL — ABNORMAL LOW (ref >39.00)
NonHDL: 167.46
Total CHOL/HDL Ratio: 7
Triglycerides: 210 mg/dL — ABNORMAL HIGH (ref 0.0–149.0)
VLDL: 42 mg/dL — ABNORMAL HIGH (ref 0.0–40.0)

## 2018-09-09 LAB — T3, FREE: T3, Free: 3.6 pg/mL (ref 2.3–4.2)

## 2018-09-09 LAB — TSH: TSH: 2.59 u[IU]/mL (ref 0.35–4.50)

## 2018-09-10 ENCOUNTER — Other Ambulatory Visit (INDEPENDENT_AMBULATORY_CARE_PROVIDER_SITE_OTHER): Payer: BC Managed Care – PPO

## 2018-09-10 DIAGNOSIS — R7309 Other abnormal glucose: Secondary | ICD-10-CM

## 2018-09-10 LAB — PSA, TOTAL WITH REFLEX TO PSA, FREE: PSA, Total: 1.1 ng/mL (ref <=4.0)

## 2018-09-10 LAB — HEMOGLOBIN A1C: Hgb A1c MFr Bld: 8.1 % — ABNORMAL HIGH (ref 4.6–6.5)

## 2018-09-17 ENCOUNTER — Encounter: Payer: Self-pay | Admitting: Family Medicine

## 2018-09-17 ENCOUNTER — Ambulatory Visit (INDEPENDENT_AMBULATORY_CARE_PROVIDER_SITE_OTHER): Payer: BC Managed Care – PPO | Admitting: Family Medicine

## 2018-09-17 ENCOUNTER — Other Ambulatory Visit: Payer: Self-pay

## 2018-09-17 VITALS — BP 130/84 | HR 92 | Temp 97.9°F | Ht 70.5 in | Wt 350.5 lb

## 2018-09-17 DIAGNOSIS — Z0000 Encounter for general adult medical examination without abnormal findings: Secondary | ICD-10-CM

## 2018-09-17 MED ORDER — ROSUVASTATIN CALCIUM 20 MG PO TABS: 20.0000 mg | ORAL_TABLET | Freq: Every day | ORAL | 3 refills | Status: DC

## 2018-09-17 NOTE — Progress Notes (Signed)
Pastor Sgro T. Sterlin Knightly, MD Primary Care and [place] at [facility] [place], [address] Phone: [phone]  FAX: [place] - 48 y.o. male  MRN [id number]  Date of Birth: 1970/05/04  Visit Date: 09/17/2018  PCP: Owens Loffler, MD  Referred by: Owens Loffler, MD  Chief Complaint  Patient presents with  . Annual Exam   Patient Care Team: Owens Loffler, MD as PCP - General Subjective:   Alex Newman is a 48 y.o. pleasant patient who presents with the following:  Preventative Health Maintenance Visit:  Health Maintenance Summary Reviewed and updated, unless pt declines services.  Tobacco History Reviewed. 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   Pneumovax - decline Tdap - decline  Frustrated with weight.  Not interested with bariatrics  Has not been to the eye MD  Health Maintenance  Topic Date Due  . PNEUMOCOCCAL POLYSACCHARIDE VACCINE AGE 45-64 HIGH RISK  04/27/1971  . FOOT EXAM  04/27/1979  . OPHTHALMOLOGY EXAM  04/27/1979  . HIV Screening  04/26/1984  . TETANUS/TDAP  04/26/1988  . INFLUENZA VACCINE  10/04/2018  . HEMOGLOBIN A1C  03/13/2019  . URINE MICROALBUMIN  09/09/2019    There is no immunization history on file for this patient. Patient Active Problem List   Diagnosis Date Noted  . GERD (gastroesophageal reflux disease) 02/11/2013  . MORBID OBESITY 05/18/2010  . BACK PAIN, LUMBAR 09/29/2007  . HYPERCHOLESTEROLEMIA 07/10/2007  . ALLERGIC RHINITIS 07/10/2007  . RENAL CALCULUS 07/10/2007  . HYPOTHYROIDISM 07/10/2007   Past Medical History:  Diagnosis Date  . Allergic rhinitis due to pollen   . GERD (gastroesophageal reflux disease) 02/11/2013  .  Hyperlipidemia   . Hypothyroidism   . Kidney stone   . Morbid obesity with BMI of 45.0-49.9, adult ([place])   . Nephrolithiasis    Past Surgical History:  Procedure Laterality Date  . GANGLION CYST EXCISION Right    wrist x 2  . kidney stone removal     x2   Social History   Socioeconomic History  . Marital status: Married    Spouse name: Not on file  . Number of children: 2  . Years of education: Not on file  . Highest education level: Not on file  Occupational History  . Occupation: Photographer: [employer]  Social Needs  . Financial resource strain: Not on file  . Food insecurity    Worry: Not on file    Inability: Not on file  . Transportation needs    Medical: Not on file    Non-medical: Not on file  Tobacco Use  . Smoking status: Never Smoker  . Smokeless tobacco: Never Used  Substance and Sexual Activity  . Alcohol use: No    Alcohol/week: 0.0 standard drinks  . Drug use: No  . Sexual activity: Yes    Partners: Female  Lifestyle  . Physical activity    Days per week: Not on file    Minutes per session: Not on file  . Stress: Not on file  Relationships  . Social Herbalist on phone: Not on file    Gets together: Not on file    Attends religious  service: Not on file    Active member of club or organization: Not on file    Attends meetings of clubs or organizations: Not on file    Relationship status: Not on file  . Intimate partner violence    Fear of current or ex partner: Not on file    Emotionally abused: Not on file    Physically abused: Not on file    Forced sexual activity: Not on file  Other Topics Concern  . Not on file  Social History Narrative  . Not on file   Family History  Problem Relation Age of Onset  . Thyroid disease Mother   . Bipolar disorder Mother   . Heart disease Father   . Neuropathy Father   . Stroke Maternal Grandfather   . Lung cancer Maternal Grandmother    Allergies   Allergen Reactions  . Cetirizine Hcl     REACTION: Headache  . Penicillins   . Propoxyphene N-Acetaminophen     REACTION: ha, nausea    Medication list has been reviewed and updated.   General: Denies fever, chills, sweats. No significant weight loss. Eyes: Denies blurring,significant itching ENT: Denies earache, sore throat, and hoarseness. Cardiovascular: Denies chest pains, palpitations, dyspnea on exertion Respiratory: Denies cough, dyspnea at rest,wheeezing Breast: no concerns about lumps GI: Denies nausea, vomiting, diarrhea, constipation, change in bowel habits, abdominal pain, melena, hematochezia GU: Denies penile discharge, ED, urinary flow / outflow problems. No STD concerns. Musculoskeletal: Denies back pain, joint pain Derm: Denies rash, itching Neuro: Denies  paresthesias, frequent falls, frequent headaches Psych: Denies depression, anxiety Endocrine: Denies cold intolerance, heat intolerance, polydipsia Heme: Denies enlarged lymph nodes Allergy: No hayfever  Objective:   BP 130/84   Pulse 92   Temp 97.9 F (36.6 C) (Skin)   Ht 5' 10.5" (1.791 m)   Wt (!) 350 lb 8 oz (159 kg)   SpO2 96%   BMI 49.58 kg/m  Ideal Body Weight: Weight in (lb) to have BMI = 25: 176.4  Ideal Body Weight: Weight in (lb) to have BMI = 25: 176.4 No exam data present Depression screen [facility] 2/9 09/17/2018 08/14/2017 08/29/2015  Decreased Interest 0 1 0  Down, Depressed, Hopeless 0 0 1  PHQ - 2 Score 0 1 1     GEN: well developed, well nourished, no acute distress Eyes: conjunctiva and lids normal, PERRLA, EOMI ENT: TM clear, nares clear, oral exam WNL Neck: supple, no lymphadenopathy, no thyromegaly, no JVD Pulm: clear to auscultation and percussion, respiratory effort normal CV: regular rate and rhythm, S1-S2, no murmur, rub or gallop, no bruits, peripheral pulses normal and symmetric, no cyanosis, clubbing, edema or varicosities GI: soft, non-tender; no hepatosplenomegaly,  masses; active bowel sounds all quadrants GU: no hernia, testicular mass, penile discharge Lymph: no cervical, axillary or inguinal adenopathy MSK: gait normal, muscle tone and strength WNL, no joint swelling, effusions, discoloration, crepitus  SKIN: clear, good turgor, color WNL, no rashes, lesions, or ulcerations Neuro: normal mental status, normal strength, sensation, and motion Psych: alert; oriented to person, place and time, normally interactive and not anxious or depressed in appearance. All labs reviewed with patient. Results for orders placed or performed in visit on 09/10/18  Hemoglobin A1c  Result Value Ref Range   Hgb A1c MFr Bld 8.1 (H) 4.6 - 6.5 %    Assessment and Plan:     ICD-10-CM   1. Healthcare maintenance  Z00.00    Alyxander essentially declines all of my recommendations  except starting a statin  Health Maintenance Exam: The patient's preventative maintenance and recommended screening tests for an annual wellness exam were reviewed in full today. Brought up to date unless services declined.  Counselled on the importance of diet, exercise, and its role in overall health and mortality. The patient's FH and SH was reviewed, including their home life, tobacco status, and drug and alcohol status.  Follow-up in 1 year for physical exam or additional follow-up below.  Follow-up: Return in about 6 months (around 03/20/2019) for diabetes follow-up. Or follow-up in 1 year if not noted.  Meds ordered this encounter  Medications  . rosuvastatin (CRESTOR) 20 MG tablet    Sig: Take 1 tablet (20 mg total) by mouth daily.    Dispense:  90 tablet    Refill:  3   Medications Discontinued During This Encounter  Medication Reason  . thyroid (ARMOUR THYROID) 15 MG tablet Change in therapy   No orders of the defined types were placed in this encounter.   Signed,  Maud Deed. Maryana Pittmon, MD   Allergies as of 09/17/2018      Reactions   Cetirizine Hcl    REACTION: Headache    Penicillins    Propoxyphene N-acetaminophen    REACTION: ha, nausea      Medication List       Accurate as of September 17, 2018  4:17 PM. If you have any questions, ask your nurse or doctor.        STOP taking these medications   thyroid 15 MG tablet Commonly known as: Armour Thyroid Stopped by: Owens Loffler, MD     TAKE these medications   Accu-Chek Aviva Plus test strip Generic drug: glucose blood USE TO CHECK BLOOD SUGAR TWO TIMES A DAY   Accu-Chek Aviva Plus w/Device Kit Use to check blood sugar two times a day   Accu-Chek [employer] Kit Use to check blood sugar two times a day   Accu-Chek FastClix Lancets Misc Use to check blood sugar two times a day   levothyroxine 75 MCG tablet Commonly known as: SYNTHROID TAKE 1 TABLET BY MOUTH EVERY DAY   metFORMIN 500 MG 24 hr tablet Commonly known as: GLUCOPHAGE-XR TAKE 1 TABLET BY MOUTH EVERY DAY WITH BREAKFAST   ondansetron 8 MG disintegrating tablet Commonly known as: ZOFRAN-ODT DISSOLVE ONE TABLET IN THE MOUTH EVERY 8HOURS AS NEEDED FOR NAUSEA AND VOMITING. What changed: See the new instructions.   polyethylene glycol 17 g packet Commonly known as: MIRALAX / GLYCOLAX Take 17 g by mouth daily. Mix one tablespoon with 8oz of your favorite juice or water every day until you are having soft formed stools. Then start taking once daily if you didn't have a stool the day before.   rosuvastatin 20 MG tablet Commonly known as: Crestor Take 1 tablet (20 mg total) by mouth daily. Started by: Owens Loffler, MD

## 2018-11-07 ENCOUNTER — Other Ambulatory Visit: Payer: Self-pay | Admitting: *Deleted

## 2018-11-07 NOTE — Telephone Encounter (Signed)
Last office visit 09/17/2018 for CPE.  Last refilled 02/06/2017 for #30 with no refills.  No future appointments.

## 2018-11-09 MED ORDER — ONDANSETRON 8 MG PO TBDP: ORAL_TABLET | ORAL | 0 refills | Status: DC

## 2018-11-11 ENCOUNTER — Other Ambulatory Visit: Payer: Self-pay | Admitting: Family Medicine

## 2018-12-17 ENCOUNTER — Other Ambulatory Visit: Payer: Self-pay | Admitting: Family Medicine

## 2018-12-23 ENCOUNTER — Telehealth: Payer: Self-pay

## 2018-12-23 NOTE — Telephone Encounter (Signed)
Pt said for last 3 - 4 months his FBS has been running high and pt has stopped eating sugar and trying to watch his diet. Pt has been taking metformin as prescribed. Today FBS in left finger was 228; pt took FBS in rt finger and was 118. Pt has been nauseated this morning and has not eaten or taken metformin yet. I asked how pt prepares to do BS and he uses hand sanitizer and waits 15 mins and then sticks finger, he does not wipe off first drop of blood and then test with 2nd drop of blood; I advised pt of how should test BS and he will try that and then call [facility] back because he is at work now and has customer in front of him.

## 2018-12-23 NOTE — Telephone Encounter (Signed)
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 FYI to [facility] CMA.

## 2019-01-25 ENCOUNTER — Other Ambulatory Visit: Payer: Self-pay | Admitting: Family Medicine

## 2019-01-25 DIAGNOSIS — E119 Type 2 diabetes mellitus without complications: Secondary | ICD-10-CM

## 2019-02-26 ENCOUNTER — Other Ambulatory Visit: Payer: Self-pay | Admitting: Urology

## 2019-03-31 ENCOUNTER — Telehealth: Payer: Self-pay | Admitting: Family Medicine

## 2019-03-31 NOTE — Telephone Encounter (Signed)
Please schedule Diabetes follow up with Dr. Patsy Lager.

## 2019-04-01 NOTE — Telephone Encounter (Signed)
Spoke with pt  He stated he was with customer and would call back to schedule

## 2019-04-22 ENCOUNTER — Other Ambulatory Visit: Payer: Self-pay | Admitting: Family Medicine

## 2019-05-24 ENCOUNTER — Other Ambulatory Visit: Payer: Self-pay | Admitting: Family Medicine

## 2019-05-25 NOTE — Telephone Encounter (Signed)
Last office visit 09/17/2018 for CPE.  AVS states to follow up in 6 months.  Called patient to try and get appointment scheduled on 04/01/2019 and he stated he would call back to schedule.  No future appointments.  Refill?

## 2019-08-21 ENCOUNTER — Other Ambulatory Visit: Payer: Self-pay | Admitting: Family Medicine

## 2019-08-26 ENCOUNTER — Other Ambulatory Visit: Payer: Self-pay | Admitting: Family Medicine

## 2019-08-26 ENCOUNTER — Ambulatory Visit: Payer: BC Managed Care – PPO | Admitting: Family Medicine

## 2019-08-26 ENCOUNTER — Other Ambulatory Visit: Payer: Self-pay

## 2019-08-26 ENCOUNTER — Encounter: Payer: Self-pay | Admitting: Family Medicine

## 2019-08-26 VITALS — BP 138/80 | HR 82 | Temp 98.1°F | Ht 70.5 in | Wt 338.2 lb

## 2019-08-26 DIAGNOSIS — E119 Type 2 diabetes mellitus without complications: Secondary | ICD-10-CM

## 2019-08-26 DIAGNOSIS — E7800 Pure hypercholesterolemia, unspecified: Secondary | ICD-10-CM | POA: Diagnosis not present

## 2019-08-26 DIAGNOSIS — E039 Hypothyroidism, unspecified: Secondary | ICD-10-CM | POA: Diagnosis not present

## 2019-08-26 DIAGNOSIS — Z125 Encounter for screening for malignant neoplasm of prostate: Secondary | ICD-10-CM | POA: Diagnosis not present

## 2019-08-26 DIAGNOSIS — Z79899 Other long term (current) drug therapy: Secondary | ICD-10-CM | POA: Diagnosis not present

## 2019-08-26 LAB — HEPATIC FUNCTION PANEL
ALT: 30 U/L (ref 0–53)
AST: 27 U/L (ref 0–37)
Albumin: 4.4 g/dL (ref 3.5–5.2)
Alkaline Phosphatase: 107 U/L (ref 39–117)
Bilirubin, Direct: 0.2 mg/dL (ref 0.0–0.3)
Total Bilirubin: 1.1 mg/dL (ref 0.2–1.2)
Total Protein: 7.2 g/dL (ref 6.0–8.3)

## 2019-08-26 LAB — BASIC METABOLIC PANEL
BUN: 14 mg/dL (ref 6–23)
CO2: 28 mEq/L (ref 19–32)
Calcium: 9.8 mg/dL (ref 8.4–10.5)
Chloride: 102 mEq/L (ref 96–112)
Creatinine, Ser: 0.93 mg/dL (ref 0.40–1.50)
GFR: 85.89 mL/min (ref >60.00)
Glucose, Bld: 155 mg/dL — ABNORMAL HIGH (ref 70–99)
Potassium: 4.4 mEq/L (ref 3.5–5.1)
Sodium: 138 mEq/L (ref 135–145)

## 2019-08-26 LAB — CBC WITH DIFFERENTIAL/PLATELET
Basophils Absolute: 0 10*3/uL (ref 0.0–0.1)
Basophils Relative: 0.5 % (ref 0.0–3.0)
Eosinophils Absolute: 0 10*3/uL (ref 0.0–0.7)
Eosinophils Relative: 0.4 % (ref 0.0–5.0)
HCT: 47 % (ref 39.0–52.0)
Hemoglobin: 16.2 g/dL (ref 13.0–17.0)
Lymphocytes Relative: 29.7 % (ref 12.0–46.0)
Lymphs Abs: 2.1 10*3/uL (ref 0.7–4.0)
MCHC: 34.5 g/dL (ref 30.0–36.0)
MCV: 86.8 fl (ref 78.0–100.0)
Monocytes Absolute: 0.5 10*3/uL (ref 0.1–1.0)
Monocytes Relative: 6.6 % (ref 3.0–12.0)
Neutro Abs: 4.5 10*3/uL (ref 1.4–7.7)
Neutrophils Relative %: 62.8 % (ref 43.0–77.0)
Platelets: 173 10*3/uL (ref 150.0–400.0)
RBC: 5.41 Mil/uL (ref 4.22–5.81)
RDW: 14.5 % (ref 11.5–15.5)
WBC: 7.1 10*3/uL (ref 4.0–10.5)

## 2019-08-26 LAB — LIPID PANEL
Cholesterol: 154 mg/dL (ref 0–200)
HDL: 27.8 mg/dL — ABNORMAL LOW (ref >39.00)
LDL Cholesterol: 94 mg/dL (ref 0–99)
NonHDL: 126.35
Total CHOL/HDL Ratio: 6
Triglycerides: 163 mg/dL — ABNORMAL HIGH (ref 0.0–149.0)
VLDL: 32.6 mg/dL (ref 0.0–40.0)

## 2019-08-26 LAB — T3, FREE: T3, Free: 3.5 pg/mL (ref 2.3–4.2)

## 2019-08-26 LAB — TSH: TSH: 1.35 u[IU]/mL (ref 0.35–4.50)

## 2019-08-26 LAB — POCT GLYCOSYLATED HEMOGLOBIN (HGB A1C): Hemoglobin A1C: 7.7 % — AB (ref 4.0–5.6)

## 2019-08-26 LAB — T4, FREE: Free T4: 0.99 ng/dL (ref 0.60–1.60)

## 2019-08-26 LAB — MICROALBUMIN / CREATININE URINE RATIO
Creatinine,U: 161 mg/dL
Microalb Creat Ratio: 1.6 mg/g (ref 0.0–30.0)
Microalb, Ur: 2.6 mg/dL — ABNORMAL HIGH (ref 0.0–1.9)

## 2019-08-26 NOTE — Progress Notes (Signed)
Chosen Garron T. Bayden Gil, MD, [place] at [facility] [place], [address]  Phone: [phone]  FAX: [phone]  Alex Newman - 49 y.o. male  MRN [id number]  Date of Birth: 1970-11-13  Date: 08/26/2019  PCP: Owens Loffler, MD  Referral: Owens Loffler, MD  Chief Complaint  Patient presents with  . Follow-up    diabetes    This visit occurred during the SARS-CoV-2 public health emergency.  Safety protocols were in place, including screening questions prior to the visit, additional usage of staff PPE, and extensive cleaning of exam room while observing appropriate contact time as indicated for disinfecting solutions.   Subjective:   Alex Newman is a 49 y.o. very pleasant male patient with Body mass index is 47.85 kg/m. who presents with the following:  Diabetes Mellitus: Tolerating Medications: yes Compliance with diet: fair, Body mass index is 47.85 kg/m. Exercise: minimal / intermittent Avg blood sugars at home: not checking Foot problems: none Hypoglycemia: none No nausea, vomitting, blurred vision, polyuria.  On metformin, 1 tablet daily with breakfast  100 - 150/160 after eating  Lab Results  Component Value Date   HGBA1C 7.7 (A) 08/26/2019   HGBA1C 8.1 (H) 09/10/2018   HGBA1C 7.5 (H) 08/05/2017   Lab Results  Component Value Date   MICROALBUR 4.3 (H) 09/09/2018   LDLCALC 139 (H) 08/02/2017   CREATININE 0.90 09/09/2018    Wt Readings from Last 3 Encounters:  08/26/19 (!) 338 lb 4 oz (153.4 kg)  09/17/18 (!) 350 lb 8 oz (159 kg)  05/15/18 (!) 351 lb 4.8 oz (159.3 kg)     Check thyroid Chol psa  Declines all vaccines  Lipids: Doing well, stable. Tolerating meds fine with no SE. Panel reviewed with patient.  Lipids:    Component Value Date/Time   CHOL 198 09/09/2018 0955   TRIG 210.0 (H) 09/09/2018 0955   HDL 30.30 (L) 09/09/2018 0955    LDLDIRECT 143.0 09/09/2018 0955   VLDL 42.0 (H) 09/09/2018 0955   CHOLHDL 7 09/09/2018 0955    Lab Results  Component Value Date   ALT 37 09/09/2018   AST 38 (H) 09/09/2018   ALKPHOS 114 09/09/2018   BILITOT 1.0 09/09/2018    Thyroid: No symptoms. Labs reviewed. Denies cold / heat intolerance, dry skin, hair loss. No goiter.  Lab Results  Component Value Date   TSH 2.59 09/09/2018      There is no immunization history on file for this patient.   Review of Systems is noted in the HPI, as appropriate  Objective:   BP 138/80   Pulse 82   Temp 98.1 F (36.7 C) (Skin)   Ht 5' 10.5" (1.791 m)   Wt (!) 338 lb 4 oz (153.4 kg)   BMI 47.85 kg/m    GEN: no acute distress. HEENT: Atraumatic, Normocephalic.  Ears and Nose: No external deformity. CV: RRR, No M/G/R. No JVD. No thrill. No extra heart sounds. PULM: CTA B, no wheezes, crackles, rhonchi. No retractions. No resp. distress. No accessory muscle use. EXTR: No c/c/e PSYCH: Normally interactive. Conversant.      Laboratory and Imaging Data: Results for orders placed or performed in visit on 08/26/19  POCT glycosylated hemoglobin (Hb A1C)  Result Value Ref Range   Hemoglobin A1C 7.7 (A) 4.0 - 5.6 %   HbA1c POC (<> result, manual entry)     HbA1c, POC (prediabetic range)  HbA1c, POC (controlled diabetic range)       Assessment and Plan:     ICD-10-CM   1. Diabetes mellitus without complication (HCC)  G40.1 POCT glycosylated hemoglobin (Hb A1C)    Basic metabolic panel    Microalbumin / creatinine urine ratio  2. Hypothyroidism, acquired  E03.9 T3, free    T4, free    TSH  3. HYPERCHOLESTEROLEMIA  E78.00 Lipid panel  4. MORBID OBESITY  E66.01   5. Screening PSA (prostate specific antigen)  Z12.5 PSA, Total with Reflex to PSA, Free  6. High risk medications (not anticoagulants) long-term use  Z79.899 CBC with Differential/Platelet    Hepatic function panel   He remarks and does that his blood sugar has  been improving even very recently, and he has been very very cautious and has learned about his diet.  I think that is reasonable to hold off on increasing his Metformin at this point.  Continue to follow this and continue to work on weight loss.  Do some additional baseline health maintenance labs, and he has not been in the office since July of last year.  He declines all vaccines.. The patient declines routine health maintenance services noted. We reviewed that could lead to missing significant problems that could affect there mortality. The patient indicated that they understood this and was willing to accept those risks.   Follow-up: Return in about 6 months (around 02/25/2020).  No orders of the defined types were placed in this encounter.  There are no discontinued medications. Orders Placed This Encounter  Procedures  . Basic metabolic panel  . CBC with Differential/Platelet  . Hepatic function panel  . Lipid panel  . PSA, Total with Reflex to PSA, Free  . Microalbumin / creatinine urine ratio  . T3, free  . T4, free  . TSH  . POCT glycosylated hemoglobin (Hb A1C)    Signed,  Carman Essick T. Noelly Lasseigne, MD   Outpatient Encounter Medications as of 08/26/2019  Medication Sig  . ACCU-CHEK AVIVA PLUS test strip USE TO CHECK BLOOD SUGAR TWO TIMES A DAY  . Accu-Chek Softclix Lancets lancets USE TO CHECK BLOOD SUGAR TWO TIMES A DAY. DAY SUPPLY PER INSURANCE  . Blood Glucose Monitoring Suppl (ACCU-CHEK AVIVA PLUS) w/Device KIT Use to check blood sugar two times a day  . Lancets Misc. (ACCU-CHEK FASTCLIX LANCET) KIT Use to check blood sugar two times a day  . levothyroxine (SYNTHROID) 75 MCG tablet TAKE 1 TABLET BY MOUTH EVERY DAY  . metFORMIN (GLUCOPHAGE-XR) 500 MG 24 hr tablet TAKE 1 TABLET BY MOUTH EVERY DAY WITH BREAKFAST  . ondansetron (ZOFRAN-ODT) 8 MG disintegrating tablet DISSOLVE ONE TABLET IN THE MOUTH EVERY 8HOURS AS NEEDED FOR NAUSEA AND VOMITING.  . polyethylene glycol  (MIRALAX / GLYCOLAX) packet Take 17 g by mouth daily. Mix one tablespoon with 8oz of your favorite juice or water every day until you are having soft formed stools. Then start taking once daily if you didn't have a stool the day before.  . rosuvastatin (CRESTOR) 20 MG tablet Take 1 tablet (20 mg total) by mouth daily.   No facility-administered encounter medications on file as of 08/26/2019.

## 2019-08-27 LAB — PSA, TOTAL WITH REFLEX TO PSA, FREE: PSA, Total: 1 ng/mL (ref <=4.0)

## 2019-09-23 DIAGNOSIS — Z20822 Contact with and (suspected) exposure to covid-19: Secondary | ICD-10-CM | POA: Diagnosis not present

## 2019-09-23 DIAGNOSIS — Z03818 Encounter for observation for suspected exposure to other biological agents ruled out: Secondary | ICD-10-CM | POA: Diagnosis not present

## 2019-10-01 ENCOUNTER — Telehealth: Payer: Self-pay

## 2019-10-01 NOTE — Telephone Encounter (Signed)
Pt left v/m requesting cb and thinks has sinus infection. I spoke with pt; on 09/26/19 has pressure in nose, head congestion, now runny nose and mucus from nose is green colored. Eyes hurt and sinus pressure over and under eyes. No fever or chills. No S/T. Pt has prod cough with green phlegm in the mornings. Pt has had sinus infection in past and the symptoms pt is having now indicating to pt he has a sinus infection. No available appts at [facility] today and offered pt virtual appt on 10/02/19 and pt declined and said he would go to [facility] in [place] today so he will not get sicker. FYI to Dr Patsy Lager.

## 2019-10-02 ENCOUNTER — Ambulatory Visit
Admission: EM | Admit: 2019-10-02 | Discharge: 2019-10-02 | Disposition: A | Discharge status: Home / Self Care | Payer: BC Managed Care – PPO | Attending: Emergency Medicine | Admitting: Emergency Medicine

## 2019-10-02 DIAGNOSIS — J0100 Acute maxillary sinusitis, unspecified: Secondary | ICD-10-CM | POA: Diagnosis not present

## 2019-10-02 MED ORDER — AZITHROMYCIN 250 MG PO TABS
250.0000 mg | ORAL_TABLET | Freq: Every day | ORAL | 0 refills | Status: DC
Start: 2019-10-02 — End: 2020-10-31

## 2019-10-02 NOTE — ED Provider Notes (Signed)
Alex Newman    CSN: [id number] Arrival date & time: 10/02/19  1022      History   Chief Complaint Chief Complaint  Patient presents with  . Sinus Problem  . Nasal Congestion    HPI Alex Newman is a 49 y.o. male.   Patient presents with nasal congestion rhinorrhea x 1.5 weeks.  He reports sinus headache and cough productive of green phlegm x 3 days.  He denies fever, chills, sore throat, shortness of breath, vomiting, diarrhea, rash, or other symptoms.  Treatment attempted at home with Sudafed.  The history is provided by the patient.    Past Medical History:  Diagnosis Date  . Allergic rhinitis due to pollen   . GERD (gastroesophageal reflux disease) 02/11/2013  . Hyperlipidemia   . Hypothyroidism   . Kidney stone   . Morbid obesity with BMI of 45.0-49.9, adult ([place])   . Nephrolithiasis     Patient Active Problem List   Diagnosis Date Noted  . GERD (gastroesophageal reflux disease) 02/11/2013  . MORBID OBESITY 05/18/2010  . BACK PAIN, LUMBAR 09/29/2007  . HYPERCHOLESTEROLEMIA 07/10/2007  . ALLERGIC RHINITIS 07/10/2007  . RENAL CALCULUS 07/10/2007  . Hypothyroidism, acquired 07/10/2007    Past Surgical History:  Procedure Laterality Date  . GANGLION CYST EXCISION Right    wrist x 2  . kidney stone removal     x2       Home Medications    Prior to Admission medications   Medication Sig Start Date End Date Taking? Authorizing Provider  ACCU-CHEK AVIVA PLUS test strip USE TO CHECK BLOOD SUGAR TWO TIMES A DAY 08/26/19   Copland, Frederico Hamman, MD  Accu-Chek Softclix Lancets lancets USE TO CHECK BLOOD SUGAR TWO TIMES A DAY. DAY SUPPLY PER INSURANCE 01/26/19   Copland, Frederico Hamman, MD  azithromycin (ZITHROMAX) 250 MG tablet Take 1 tablet (250 mg total) by mouth daily. Take first 2 tablets together, then 1 every day until finished. 10/02/19   Sharion Balloon, NP  Blood Glucose Monitoring Suppl (ACCU-CHEK AVIVA PLUS) w/Device KIT Use to check blood sugar two  times a day 08/14/17   Copland, Frederico Hamman, MD  Lancets Misc. (ACCU-CHEK FASTCLIX LANCET) KIT Use to check blood sugar two times a day 08/14/17   Copland, Frederico Hamman, MD  levothyroxine (SYNTHROID) 75 MCG tablet TAKE 1 TABLET BY MOUTH EVERY DAY 11/11/18   Copland, Frederico Hamman, MD  metFORMIN (GLUCOPHAGE-XR) 500 MG 24 hr tablet TAKE 1 TABLET BY MOUTH EVERY DAY WITH BREAKFAST 08/21/19   Copland, Frederico Hamman, MD  ondansetron (ZOFRAN-ODT) 8 MG disintegrating tablet DISSOLVE ONE TABLET IN THE MOUTH EVERY 8HOURS AS NEEDED FOR NAUSEA AND VOMITING. 11/09/18   Copland, Frederico Hamman, MD  polyethylene glycol (MIRALAX / GLYCOLAX) packet Take 17 g by mouth daily. Mix one tablespoon with 8oz of your favorite juice or water every day until you are having soft formed stools. Then start taking once daily if you didn't have a stool the day before. 03/25/18   Merlyn Lot, MD  rosuvastatin (CRESTOR) 20 MG tablet Take 1 tablet (20 mg total) by mouth daily. 09/17/18   Owens Loffler, MD    Family History Family History  Problem Relation Age of Onset  . Thyroid disease Mother   . Bipolar disorder Mother   . Heart disease Father   . Neuropathy Father   . Stroke Maternal Grandfather   . Lung cancer Maternal Grandmother     Social History Social History   Tobacco Use  . Smoking status: Never  Smoker  . Smokeless tobacco: Never Used  Vaping Use  . Vaping Use: Never used  Substance Use Topics  . Alcohol use: No    Alcohol/week: 0.0 standard drinks  . Drug use: No     Allergies   Cetirizine hcl, Penicillins, and Propoxyphene n-acetaminophen   Review of Systems Review of Systems  Constitutional: Negative for chills and fever.  HENT: Positive for congestion, postnasal drip, rhinorrhea and sinus pressure. Negative for ear pain and sore throat.   Eyes: Negative for pain and visual disturbance.  Respiratory: Negative for cough and shortness of breath.   Cardiovascular: Negative for chest pain and palpitations.    Gastrointestinal: Negative for abdominal pain, diarrhea, nausea and vomiting.  Genitourinary: Negative for dysuria and hematuria.  Musculoskeletal: Negative for arthralgias and back pain.  Skin: Negative for color change and rash.  Neurological: Negative for seizures and syncope.  All other systems reviewed and are negative.    Physical Exam Triage Vital Signs ED Triage Vitals  Enc Vitals Group     BP      Pulse      Resp      Temp      Temp src      SpO2      Weight      Height      Head Circumference      Peak Flow      Pain Score      Pain Loc      Pain Edu?      Excl. in [place]?    No data found.  Updated Vital Signs BP 117/83   Pulse (!) 111   Temp 99.7 F (37.6 C)   Resp 20   SpO2 97%   Visual Acuity Right Eye Distance:   Left Eye Distance:   Bilateral Distance:    Right Eye Near:   Left Eye Near:    Bilateral Near:     Physical Exam Vitals and nursing note reviewed.  Constitutional:      General: He is not in acute distress.    Appearance: He is well-developed. He is obese.  HENT:     Head: Normocephalic and atraumatic.     Right Ear: Tympanic membrane normal.     Left Ear: Tympanic membrane normal.     Nose: Congestion and rhinorrhea present.     Mouth/Throat:     Mouth: Mucous membranes are moist.     Pharynx: Oropharynx is clear.  Eyes:     Conjunctiva/sclera: Conjunctivae normal.  Cardiovascular:     Rate and Rhythm: Normal rate and regular rhythm.     Heart sounds: No murmur heard.   Pulmonary:     Effort: Pulmonary effort is normal. No respiratory distress.     Breath sounds: Normal breath sounds.  Abdominal:     Palpations: Abdomen is soft.     Tenderness: There is no abdominal tenderness. There is no guarding or rebound.  Musculoskeletal:     Cervical back: Neck supple.  Skin:    General: Skin is warm and dry.     Findings: No rash.  Neurological:     General: No focal deficit present.     Mental Status: He is alert and  oriented to person, place, and time.     Gait: Gait normal.  Psychiatric:        Mood and Affect: Mood normal.        Behavior: Behavior normal.      UC Treatments /  Results  Labs (all labs ordered are listed, but only abnormal results are displayed) Labs Reviewed - No data to display  EKG   Radiology No results found.  Procedures Procedures (including critical care time)  Medications Ordered in UC Medications - No data to display  Initial Impression / Assessment and Plan / UC Course  I have reviewed the triage vital signs and the nursing notes.  Pertinent labs & imaging results that were available during my care of the patient were reviewed by me and considered in my medical decision making (see chart for details).   Acute sinusitis.  Patient had a negative COVID test this week.  Treating with Zithromax.  Suggested OTC plain Mucinex.  Instructed patient to follow-up with his PCP if his symptoms are not improving.  Patient agrees to plan of care.   Final Clinical Impressions(s) / UC Diagnoses   Final diagnoses:  Acute non-recurrent maxillary sinusitis     Discharge Instructions     Take the Zithromax as directed.    Follow up with your primary care provider if your symptoms are not improving.       ED Prescriptions    Medication Sig Dispense Auth. Provider   azithromycin (ZITHROMAX) 250 MG tablet Take 1 tablet (250 mg total) by mouth daily. Take first 2 tablets together, then 1 every day until finished. 6 tablet Sharion Balloon, NP     PDMP not reviewed this encounter.   Sharion Balloon, NP 10/02/19 1100

## 2019-10-02 NOTE — Telephone Encounter (Signed)
Pt left v/m that he has tried 2 UC and they are not accepting walk ins and not giving appts. Pt said the sinus pressure in nose is unbearable. No appts available at [facility] today. I spoke with pt; I called [facility] in [place] and they are accepting walk in pts and do not have a wait at this time. Pt voiced understanding and is going to got to [facility] in [place] now.

## 2019-10-02 NOTE — Discharge Instructions (Signed)
Take the Zithromax as directed.  Follow up with your primary care provider if your symptoms are not improving.    

## 2019-10-02 NOTE — ED Triage Notes (Signed)
Patient complain of nasal congestion and head pressure since Monday. Patient was [facility] tested [facility] which was negative. Patient has been taking otc sudafed.

## 2019-10-06 ENCOUNTER — Other Ambulatory Visit: Payer: Self-pay

## 2019-10-06 ENCOUNTER — Emergency Department
Admission: EM | Admit: 2019-10-06 | Discharge: 2019-10-06 | Disposition: A | Discharge status: Home / Self Care | Payer: BC Managed Care – PPO | Attending: Emergency Medicine | Admitting: Emergency Medicine

## 2019-10-06 ENCOUNTER — Ambulatory Visit
Admission: EM | Admit: 2019-10-06 | Discharge: 2019-10-06 | Disposition: A | Discharge status: Home / Self Care | Payer: BC Managed Care – PPO | Attending: Emergency Medicine | Admitting: Emergency Medicine

## 2019-10-06 DIAGNOSIS — R531 Weakness: Secondary | ICD-10-CM | POA: Diagnosis not present

## 2019-10-06 DIAGNOSIS — R197 Diarrhea, unspecified: Secondary | ICD-10-CM | POA: Insufficient documentation

## 2019-10-06 DIAGNOSIS — R0981 Nasal congestion: Secondary | ICD-10-CM

## 2019-10-06 DIAGNOSIS — J019 Acute sinusitis, unspecified: Secondary | ICD-10-CM | POA: Insufficient documentation

## 2019-10-06 DIAGNOSIS — E876 Hypokalemia: Secondary | ICD-10-CM | POA: Insufficient documentation

## 2019-10-06 DIAGNOSIS — Z880 Allergy status to penicillin: Secondary | ICD-10-CM | POA: Insufficient documentation

## 2019-10-06 DIAGNOSIS — E039 Hypothyroidism, unspecified: Secondary | ICD-10-CM | POA: Diagnosis not present

## 2019-10-06 DIAGNOSIS — E119 Type 2 diabetes mellitus without complications: Secondary | ICD-10-CM | POA: Diagnosis not present

## 2019-10-06 DIAGNOSIS — Z794 Long term (current) use of insulin: Secondary | ICD-10-CM | POA: Insufficient documentation

## 2019-10-06 DIAGNOSIS — J0100 Acute maxillary sinusitis, unspecified: Secondary | ICD-10-CM

## 2019-10-06 LAB — COMPREHENSIVE METABOLIC PANEL
ALT: 38 U/L (ref 0–44)
AST: 38 U/L (ref 15–41)
Albumin: 3.9 g/dL (ref 3.5–5.0)
Alkaline Phosphatase: 117 U/L (ref 38–126)
Anion gap: 12 (ref 5–15)
BUN: 9 mg/dL (ref 6–20)
CO2: 26 mmol/L (ref 22–32)
Calcium: 9.4 mg/dL (ref 8.9–10.3)
Chloride: 105 mmol/L (ref 98–111)
Creatinine, Ser: 0.96 mg/dL (ref 0.61–1.24)
GFR calc Af Amer: >60 mL/min (ref >60)
GFR calc non Af Amer: >60 mL/min (ref >60)
Glucose, Bld: 187 mg/dL — ABNORMAL HIGH (ref 70–99)
Potassium: 3.2 mmol/L — ABNORMAL LOW (ref 3.5–5.1)
Sodium: 143 mmol/L (ref 135–145)
Total Bilirubin: 1.2 mg/dL (ref 0.3–1.2)
Total Protein: 7.8 g/dL (ref 6.5–8.1)

## 2019-10-06 LAB — URINALYSIS, COMPLETE (UACMP) WITH MICROSCOPIC
Bacteria, UA: NONE SEEN
Bilirubin Urine: NEGATIVE
Glucose, UA: NEGATIVE mg/dL
Ketones, ur: NEGATIVE mg/dL
Nitrite: NEGATIVE
Protein, ur: 30 mg/dL — AB
Specific Gravity, Urine: 1.013 (ref 1.005–1.030)
Squamous Epithelial / HPF: NONE SEEN (ref 0–5)
WBC, UA: >50 WBC/hpf — ABNORMAL HIGH (ref 0–5)
pH: 6 (ref 5.0–8.0)

## 2019-10-06 LAB — CBC
HCT: 48 % (ref 39.0–52.0)
Hemoglobin: 16 g/dL (ref 13.0–17.0)
MCH: 28.9 pg (ref 26.0–34.0)
MCHC: 33.3 g/dL (ref 30.0–36.0)
MCV: 86.8 fL (ref 80.0–100.0)
Platelets: 184 10*3/uL (ref 150–400)
RBC: 5.53 MIL/uL (ref 4.22–5.81)
RDW: 14.4 % (ref 11.5–15.5)
WBC: 4.7 10*3/uL (ref 4.0–10.5)
nRBC: 0 % (ref 0.0–0.2)

## 2019-10-06 LAB — LIPASE, BLOOD: Lipase: 36 U/L (ref 11–51)

## 2019-10-06 LAB — POCT FASTING CBG KUC MANUAL ENTRY: POCT Glucose (KUC): 106 mg/dL — AB (ref 70–99)

## 2019-10-06 MED ORDER — ACETAMINOPHEN 500 MG PO TABS
1000.0000 mg | ORAL_TABLET | Freq: Once | ORAL | Status: AC
  Administered 2019-10-06: 1000 mg via ORAL
  Filled 2019-10-06: qty 2

## 2019-10-06 MED ORDER — LACTATED RINGERS IV BOLUS
1000.0000 mL | Freq: Once | INTRAVENOUS | Status: AC
  Administered 2019-10-06: 1000 mL via INTRAVENOUS

## 2019-10-06 MED ORDER — POTASSIUM CHLORIDE CRYS ER 20 MEQ PO TBCR
40.0000 meq | EXTENDED_RELEASE_TABLET | Freq: Once | ORAL | Status: AC
  Administered 2019-10-06: 40 meq via ORAL
  Filled 2019-10-06: qty 2

## 2019-10-06 MED ORDER — KETOROLAC TROMETHAMINE 30 MG/ML IJ SOLN
15.0000 mg | Freq: Once | INTRAMUSCULAR | Status: AC
  Administered 2019-10-06: 15 mg via INTRAVENOUS
  Filled 2019-10-06: qty 1

## 2019-10-06 NOTE — ED Provider Notes (Signed)
[facility] Emergency Department Provider Note ____________________________________________   First MD Initiated Contact with Patient 10/06/19 1214     (approximate)  I have reviewed the triage vital signs and the nursing notes.  HISTORY  Chief Complaint Nasal Congestion and Diarrhea   HPI Alex Newman is a 49 y.o. male who presents to the ED for evaluation of subacute nasal congestion and watery diarrhea.  Chart review indicates patient was seen earlier this morning at local urgent care center and directed here for further evaluation. Patient seen also on 7/30 and provided a Z-Pak for sinus infection. Otherwise history of hypothyroidism on Synthroid, HLD, DM on Metformin    Patient ports 2 weeks of upper respiratory congestion, rhinorrhea, postnasal drip, sore throat and watery diarrhea.  Patient reports compliance with a Z-Pak that he received 4 days ago, but despite this has had persistent symptoms.  Reports respiratory congestion is very thick and he has green-colored sputum.  Reports blowing his nose and getting "a lot out" for single morning, and then very minimal amounts throughout the rest of the day.  Reports pain/pressure sensation that is 7/10 intensity to his bilateral maxillary sinus area as the primary complaint and reason to come to the ED today.  Reports this pain is aching in nature, constant and not relieved by home Mucinex. Patient reports taking 2 doses of liquid Mucinex regular strength at home, but has not taken any other medications to assist with his pain or discomfort.  Patient denies any fevers, syncope, global headache, vision changes, difficulty swallowing, shortness of breath, productive cough, chest pain.   Past Medical History:  Diagnosis Date  . Allergic rhinitis due to pollen   . GERD (gastroesophageal reflux disease) 02/11/2013  . Hyperlipidemia   . Hypothyroidism   . Kidney stone   . Morbid obesity with BMI of 45.0-49.9,  adult ([place])   . Nephrolithiasis     Patient Active Problem List   Diagnosis Date Noted  . GERD (gastroesophageal reflux disease) 02/11/2013  . MORBID OBESITY 05/18/2010  . BACK PAIN, LUMBAR 09/29/2007  . HYPERCHOLESTEROLEMIA 07/10/2007  . ALLERGIC RHINITIS 07/10/2007  . RENAL CALCULUS 07/10/2007  . Hypothyroidism, acquired 07/10/2007    Past Surgical History:  Procedure Laterality Date  . GANGLION CYST EXCISION Right    wrist x 2  . kidney stone removal     x2    Prior to Admission medications   Medication Sig Start Date End Date Taking? Authorizing Provider  ACCU-CHEK AVIVA PLUS test strip USE TO CHECK BLOOD SUGAR TWO TIMES A DAY 08/26/19   Copland, Frederico Hamman, MD  Accu-Chek Softclix Lancets lancets USE TO CHECK BLOOD SUGAR TWO TIMES A DAY. DAY SUPPLY PER INSURANCE 01/26/19   Copland, Frederico Hamman, MD  azithromycin (ZITHROMAX) 250 MG tablet Take 1 tablet (250 mg total) by mouth daily. Take first 2 tablets together, then 1 every day until finished. 10/02/19   Sharion Balloon, NP  Blood Glucose Monitoring Suppl (ACCU-CHEK AVIVA PLUS) w/Device KIT Use to check blood sugar two times a day 08/14/17   Copland, Frederico Hamman, MD  Lancets Misc. (ACCU-CHEK FASTCLIX LANCET) KIT Use to check blood sugar two times a day 08/14/17   Copland, Frederico Hamman, MD  levothyroxine (SYNTHROID) 75 MCG tablet TAKE 1 TABLET BY MOUTH EVERY DAY 11/11/18   Copland, Frederico Hamman, MD  metFORMIN (GLUCOPHAGE-XR) 500 MG 24 hr tablet TAKE 1 TABLET BY MOUTH EVERY DAY WITH BREAKFAST 08/21/19   Copland, Frederico Hamman, MD  ondansetron (ZOFRAN-ODT) 8 MG disintegrating tablet  DISSOLVE ONE TABLET IN THE MOUTH EVERY 8HOURS AS NEEDED FOR NAUSEA AND VOMITING. 11/09/18   Copland, Frederico Hamman, MD  polyethylene glycol (MIRALAX / GLYCOLAX) packet Take 17 g by mouth daily. Mix one tablespoon with 8oz of your favorite juice or water every day until you are having soft formed stools. Then start taking once daily if you didn't have a stool the day before. 03/25/18   Merlyn Lot, MD  rosuvastatin (CRESTOR) 20 MG tablet Take 1 tablet (20 mg total) by mouth daily. 09/17/18   Copland, Frederico Hamman, MD    Allergies Cetirizine hcl, Penicillins, and Propoxyphene n-acetaminophen  Family History  Problem Relation Age of Onset  . Thyroid disease Mother   . Bipolar disorder Mother   . Heart disease Father   . Neuropathy Father   . Stroke Maternal Grandfather   . Lung cancer Maternal Grandmother     Social History Social History   Tobacco Use  . Smoking status: Never Smoker  . Smokeless tobacco: Never Used  Vaping Use  . Vaping Use: Never used  Substance Use Topics  . Alcohol use: No    Alcohol/week: 0.0 standard drinks  . Drug use: No    Review of Systems  Constitutional: No fever/chills Eyes: No visual changes. ENT: Positive for maxillary sinus pain, respiratory congestion, rhinorrhea and postnasal drip. Cardiovascular: Denies chest pain. Respiratory: Denies shortness of breath. Gastrointestinal: No abdominal pain.  No nausea, no vomiting.  No diarrhea.  No constipation. Genitourinary: Negative for dysuria. Musculoskeletal: Negative for back pain. Skin: Negative for rash. Neurological: Negative for headaches, focal weakness or numbness.   ____________________________________________   PHYSICAL EXAM:  VITAL SIGNS: Vitals:   10/06/19 1027 10/06/19 1429  BP: (!) 137/95 130/78  Pulse: 90 88  Resp: 17 16  Temp: 98 F (36.7 C)   SpO2: 94% 95%      Constitutional: Alert and oriented.  Obese.  Sitting up in bed, conversational in full sentences.  Appears uncomfortable. Eyes: Conjunctivae are normal. PERRL. EOMI. Head: Atraumatic. Nose: Upper respiratory congestion noted.  No evidence of facial or nasal trauma.  Mild tenderness to bilateral maxillary sinuses, nontender frontal sinuses.  EOM intact and does not cause pain, no evidence of entrapment. Mouth/Throat: Mucous membranes are moist.  Erythematous posterior oropharynx with visualized  postnasal drip.  Bilateral tonsils are 1+, without exudate.  Uvula is midline.  No difficulty handling secretions.. Neck: No stridor. No cervical spine tenderness to palpation. Cardiovascular: Normal rate, regular rhythm. Grossly normal heart sounds.  Good peripheral circulation. Respiratory: Normal respiratory effort.  No retractions. Lungs CTAB. Gastrointestinal: Soft , nondistended, nontender to palpation. No abdominal bruits. No CVA tenderness. Musculoskeletal: No lower extremity tenderness nor edema.  No joint effusions. No signs of acute trauma. Neurologic:  Normal speech and language. No gross focal neurologic deficits are appreciated. No gait instability noted. Skin:  Skin is warm, dry and intact. No rash noted. Psychiatric: Mood and affect are normal. Speech and behavior are normal.  ____________________________________________   LABS (all labs ordered are listed, but only abnormal results are displayed)  Labs Reviewed  COMPREHENSIVE METABOLIC PANEL - Abnormal; Notable for the following components:      Result Value   Potassium 3.2 (*)    Glucose, Bld 187 (*)    All other components within normal limits  URINALYSIS, COMPLETE (UACMP) WITH MICROSCOPIC - Abnormal; Notable for the following components:   Color, Urine YELLOW (*)    APPearance HAZY (*)    Hgb urine dipstick SMALL (*)  Protein, ur 30 (*)    Leukocytes,Ua MODERATE (*)    WBC, UA >50 (*)    All other components within normal limits  URINE CULTURE  LIPASE, BLOOD  CBC   ____________________________   PROCEDURES and INTERVENTIONS  Procedure(s) performed (including Critical Care):  Procedures  Medications  lactated ringers bolus 1,000 mL (0 mLs Intravenous Stopped 10/06/19 1458)  acetaminophen (TYLENOL) tablet 1,000 mg (1,000 mg Oral Given 10/06/19 1249)  ketorolac (TORADOL) 30 MG/ML injection 15 mg (15 mg Intravenous Given 10/06/19 1250)  potassium chloride SA (KLOR-CON) CR tablet 40 mEq (40 mEq Oral Given  10/06/19 1249)    ____________________________________________   INITIAL IMPRESSION / ASSESSMENT AND PLAN / ED COURSE  Obese 49 year old male presenting with subacute sinus pressure and watery diarrhea, most consistent with a viral syndrome, and amenable to outpatient management.  Normal vital signs on room air.  Exam initially with an uncomfortable-appearing patient, though no distress, neurovascular deficits or trauma.  No evidence of EOM entrapment or more severe bacterial infection of the face or sinuses.  He is general in mild tenderness to palpation of his bilateral maxillary sinuses.  Otherwise no evidence of acute pathology on exam.  Blood work with mild hypokalemia, repleted orally, otherwise without acute derangements.  Lack of leukocytosis further supports likely viral etiology.  UA with some infectious features, the patient is asymptomatic, so additional antibiotics were withheld, and urine sent for culture.  Patient reports improving symptoms after Tylenol/Toradol.  We discussed outpatient management of his sinusitis, including decongestants such as guaifenesin/Mucinex, oral hydration and Tylenol/ibuprofen for discomfort.  Return precautions for the ED were discussed and patient is medically stable for discharge home.  Clinical Course as of Oct 05 1801  Tue Oct 06, 2019  1416 Reassessed.  Patient reports improving symptoms.  Educated patient on UA with some leukocytes, and patient denies having dysuria, incontinence, sensation of incomplete emptying or other symptoms of a possible cystitis/UTI.  Advised patient that we will send this for culture, but will not start antibiotics on top of his current Z-Pak for sinus infection, as he does not have symptoms to suggest a UTI.   [DS]    Clinical Course User Index [DS] Vladimir Crofts, MD     ____________________________________________   FINAL CLINICAL IMPRESSION(S) / ED DIAGNOSES  Final diagnoses:  Nasal congestion  Diarrhea,  unspecified type  Acute non-recurrent maxillary sinusitis  Hypokalemia     ED Discharge Orders    None       Benjamine Strout   Note:  This document was prepared using Dragon voice recognition software and may include unintentional dictation errors.   Vladimir Crofts, MD 10/06/19 [phone]

## 2019-10-06 NOTE — ED Triage Notes (Signed)
Pt c/o sinus pain and pressure, states he has had sinus congestion for the past 2 weeks and has a hx of seasonal allergies. States he was tested for covid 10days ago it was negative. Pt also c/o having diarrhea and wt loss of 12lbs in the past week.. pt was seen from [facility]

## 2019-10-06 NOTE — Discharge Instructions (Addendum)
Go to the Emergency Department for evaluation of your symptoms.

## 2019-10-06 NOTE — ED Provider Notes (Signed)
Roderic Palau    CSN: [id number] Arrival date & time: 10/06/19  6283      History   Chief Complaint Chief Complaint  Patient presents with  . Facial Pain  . Diarrhea    HPI Alex Newman is a 49 y.o. male.   Patient presents with generalized weakness, loss of appetite, diarrhea x4 days.  He reports 2-3 episodes of diarrhea daily.  He feels shaky and nervous.  No emesis, fever, chills.  He was seen here on 10/01/2019; diagnosed with sinusitis and treated with Zithromax.  He reports a 12 pound weight loss since then due to no appetite and diarrhea.  He also reports ongoing sinus congestion.  The history is provided by the patient.    Past Medical History:  Diagnosis Date  . Allergic rhinitis due to pollen   . GERD (gastroesophageal reflux disease) 02/11/2013  . Hyperlipidemia   . Hypothyroidism   . Kidney stone   . Morbid obesity with BMI of 45.0-49.9, adult ([place])   . Nephrolithiasis     Patient Active Problem List   Diagnosis Date Noted  . GERD (gastroesophageal reflux disease) 02/11/2013  . MORBID OBESITY 05/18/2010  . BACK PAIN, LUMBAR 09/29/2007  . HYPERCHOLESTEROLEMIA 07/10/2007  . ALLERGIC RHINITIS 07/10/2007  . RENAL CALCULUS 07/10/2007  . Hypothyroidism, acquired 07/10/2007    Past Surgical History:  Procedure Laterality Date  . GANGLION CYST EXCISION Right    wrist x 2  . kidney stone removal     x2       Home Medications    Prior to Admission medications   Medication Sig Start Date End Date Taking? Authorizing Provider  ACCU-CHEK AVIVA PLUS test strip USE TO CHECK BLOOD SUGAR TWO TIMES A DAY 08/26/19   Copland, Frederico Hamman, MD  Accu-Chek Softclix Lancets lancets USE TO CHECK BLOOD SUGAR TWO TIMES A DAY. DAY SUPPLY PER INSURANCE 01/26/19   Copland, Frederico Hamman, MD  azithromycin (ZITHROMAX) 250 MG tablet Take 1 tablet (250 mg total) by mouth daily. Take first 2 tablets together, then 1 every day until finished. 10/02/19   Sharion Balloon, NP  Blood  Glucose Monitoring Suppl (ACCU-CHEK AVIVA PLUS) w/Device KIT Use to check blood sugar two times a day 08/14/17   Copland, Frederico Hamman, MD  Lancets Misc. (ACCU-CHEK FASTCLIX LANCET) KIT Use to check blood sugar two times a day 08/14/17   Copland, Frederico Hamman, MD  levothyroxine (SYNTHROID) 75 MCG tablet TAKE 1 TABLET BY MOUTH EVERY DAY 11/11/18   Copland, Frederico Hamman, MD  metFORMIN (GLUCOPHAGE-XR) 500 MG 24 hr tablet TAKE 1 TABLET BY MOUTH EVERY DAY WITH BREAKFAST 08/21/19   Copland, Frederico Hamman, MD  ondansetron (ZOFRAN-ODT) 8 MG disintegrating tablet DISSOLVE ONE TABLET IN THE MOUTH EVERY 8HOURS AS NEEDED FOR NAUSEA AND VOMITING. 11/09/18   Copland, Frederico Hamman, MD  polyethylene glycol (MIRALAX / GLYCOLAX) packet Take 17 g by mouth daily. Mix one tablespoon with 8oz of your favorite juice or water every day until you are having soft formed stools. Then start taking once daily if you didn't have a stool the day before. 03/25/18   Merlyn Lot, MD  rosuvastatin (CRESTOR) 20 MG tablet Take 1 tablet (20 mg total) by mouth daily. 09/17/18   Owens Loffler, MD    Family History Family History  Problem Relation Age of Onset  . Thyroid disease Mother   . Bipolar disorder Mother   . Heart disease Father   . Neuropathy Father   . Stroke Maternal Grandfather   . Lung  cancer Maternal Grandmother     Social History Social History   Tobacco Use  . Smoking status: Never Smoker  . Smokeless tobacco: Never Used  Vaping Use  . Vaping Use: Never used  Substance Use Topics  . Alcohol use: No    Alcohol/week: 0.0 standard drinks  . Drug use: No     Allergies   Cetirizine hcl, Penicillins, and Propoxyphene n-acetaminophen   Review of Systems Review of Systems  Constitutional: Positive for appetite change. Negative for chills and fever.  HENT: Positive for congestion. Negative for ear pain and sore throat.   Eyes: Negative for pain and visual disturbance.  Respiratory: Negative for cough and shortness of breath.     Cardiovascular: Negative for chest pain and palpitations.  Gastrointestinal: Positive for diarrhea. Negative for abdominal pain and vomiting.  Genitourinary: Negative for dysuria and hematuria.  Musculoskeletal: Negative for arthralgias and back pain.  Skin: Negative for color change and rash.  Neurological: Positive for weakness. Negative for seizures and syncope.  Psychiatric/Behavioral: The patient is nervous/anxious.   All other systems reviewed and are negative.    Physical Exam Triage Vital Signs ED Triage Vitals  Enc Vitals Group     BP 10/06/19 0945 120/76     Pulse Rate 10/06/19 0945 89     Resp 10/06/19 0945 18     Temp 10/06/19 0945 98.1 F (36.7 C)     Temp Source 10/06/19 0945 Oral     SpO2 10/06/19 0945 96 %     Weight --      Height --      Head Circumference --      Peak Flow --      Pain Score 10/06/19 0946 0     Pain Loc --      Pain Edu? --      Excl. in [place]? --    No data found.  Updated Vital Signs BP 120/76   Pulse 89   Temp 98.1 F (36.7 C) (Oral)   Resp 18   SpO2 96%   Visual Acuity Right Eye Distance:   Left Eye Distance:   Bilateral Distance:    Right Eye Near:   Left Eye Near:    Bilateral Near:     Physical Exam Vitals and nursing note reviewed.  Constitutional:      General: He is not in acute distress.    Appearance: He is well-developed. He is obese. He is ill-appearing.  HENT:     Head: Normocephalic and atraumatic.     Mouth/Throat:     Mouth: Mucous membranes are moist.     Pharynx: Oropharynx is clear.  Eyes:     Conjunctiva/sclera: Conjunctivae normal.  Cardiovascular:     Rate and Rhythm: Normal rate and regular rhythm.     Heart sounds: Normal heart sounds. No murmur heard.   Pulmonary:     Effort: Pulmonary effort is normal. No respiratory distress.     Breath sounds: Normal breath sounds. No wheezing or rhonchi.  Abdominal:     Palpations: Abdomen is soft.     Tenderness: There is no abdominal tenderness.  There is no guarding or rebound.  Musculoskeletal:     Cervical back: Neck supple.  Skin:    General: Skin is warm and dry.     Findings: No rash.  Neurological:     General: No focal deficit present.     Mental Status: He is alert and oriented to person, place, and time.  Gait: Gait normal.     Comments: No focal weakness.   Psychiatric:        Mood and Affect: Mood normal.        Behavior: Behavior normal.      UC Treatments / Results  Labs (all labs ordered are listed, but only abnormal results are displayed) Labs Reviewed  POCT FASTING CBG [facility] MANUAL ENTRY - Abnormal; Notable for the following components:      Result Value   POCT Glucose ([facility]) 106 (*)    All other components within normal limits    EKG   Radiology No results found.  Procedures Procedures (including critical care time)  Medications Ordered in UC Medications - No data to display  Initial Impression / Assessment and Plan / UC Course  I have reviewed the triage vital signs and the nursing notes.  Pertinent labs & imaging results that were available during my care of the patient were reviewed by me and considered in my medical decision making (see chart for details).   Weakness, diarrhea.  Sending patient to the ED for evaluation.  Patient is ill-appearing.  I feel that he needs a higher level of care that can provide stat testing and diagnostics.   Final Clinical Impressions(s) / UC Diagnoses   Final diagnoses:  Weakness  Diarrhea, unspecified type     Discharge Instructions     Go to the Emergency Department for evaluation of your symptoms.        ED Prescriptions    None     PDMP not reviewed this encounter.   Sharion Balloon, NP 10/06/19 1006

## 2019-10-06 NOTE — Discharge Instructions (Addendum)
You were seen in the ED because of your continued sinus pain, drainage and diarrhea.  Your blood work showed a slightly low potassium level, that we replaced.  But otherwise did not have signs of acute problems. I do not think you need additional antibiotics, you have already been provided a Z-Pak, and this is likely due to a bad viral sinusitis anyways.  I would recommend drinking plenty of water to maintain hydration and help thin the mucus in your nose.  Further recommend getting additional Mucinex, without the cough/cold additives, for more guaifenesin to help thin the mucus.  Below is a picture of what we discussed at the bedside together.  Use Tylenol and ibuprofen for pain, it is safe to take these medications in addition to Mucinex. Please take Tylenol and ibuprofen/Advil for your pain.  It is safe to take them together, or to alternate them every few hours.  Take up to 1000mg  of Tylenol at a time, up to 4 times per day.  Do not take more than 4000 mg of Tylenol in 24 hours.  For ibuprofen, take 400-600 mg, 4-5 times per day.  If you develop worsening symptoms despite these medications, fevers, difficulty breathing or swallowing her spit, please return to the ED.

## 2019-10-06 NOTE — ED Triage Notes (Addendum)
Pt Presents to UC for facial pain, nasal congestion, weakness, and loss of appetite since Friday. Pt states he was seen here given azithromycin and began to loose appetite, and have diarrhea resulting in 12lb weight loss since Friday. Pt vitals stable in triage. Pt states he is only taking in water PO, and has 2-3 episodes of diarrhea a day.

## 2019-10-08 LAB — URINE CULTURE: Culture: 80000 — AB

## 2019-11-17 ENCOUNTER — Other Ambulatory Visit: Payer: Self-pay | Admitting: Family Medicine

## 2019-11-20 ENCOUNTER — Other Ambulatory Visit: Payer: Self-pay | Admitting: Family Medicine

## 2019-12-12 IMAGING — CT CT RENAL STONE PROTOCOL
2 of 4 series · 16 of 46 positions shown, 18 images · non-contrast
Comparison: None.

CLINICAL DATA: Right flank pain

EXAM:
CT ABDOMEN AND PELVIS WITHOUT CONTRAST
TECHNIQUE: Multidetector CT imaging of the abdomen and pelvis was performed
following the standard protocol without IV contrast.

[Series 2: stone full standard · axial · 0.96mm/px · z∈[-719,-209]mm · 13 of 112 slices shown, 15 images]
[im 5/112  soft-tissue]
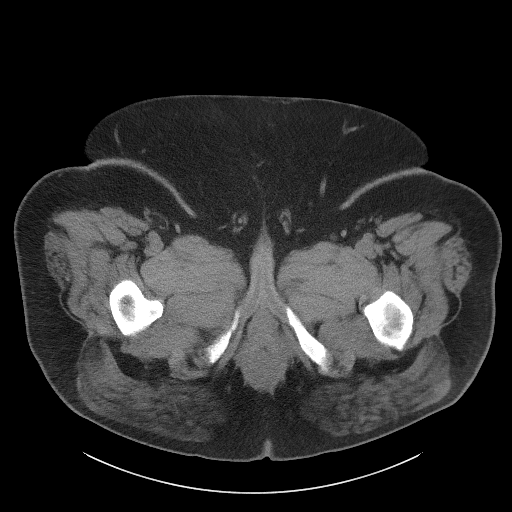
[im 5/112  bone]
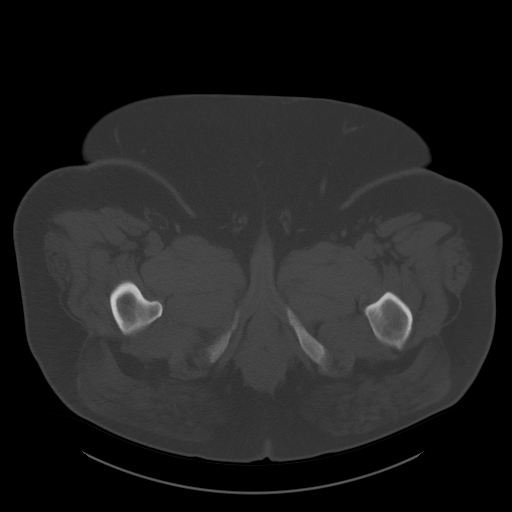
[im 14/112  soft-tissue]
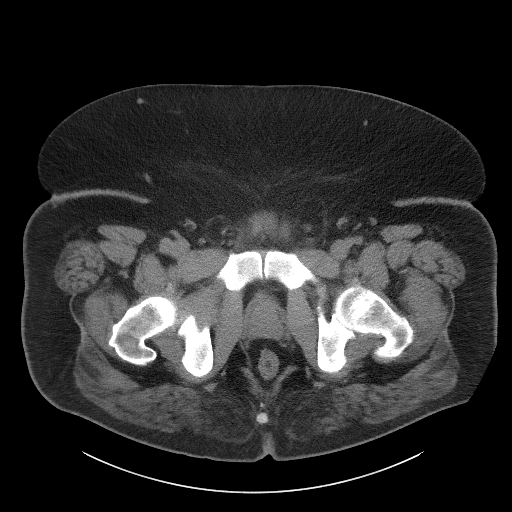
[im 24/112  soft-tissue]
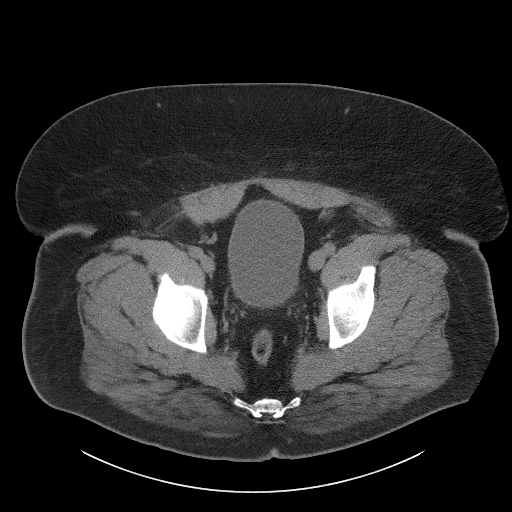
[im 33/112  soft-tissue]
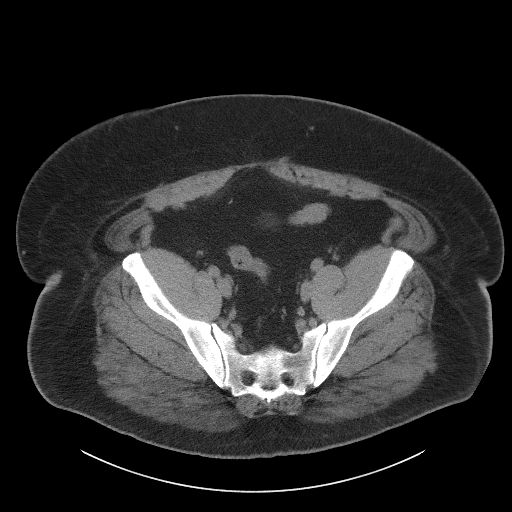
[im 38/112  soft-tissue]
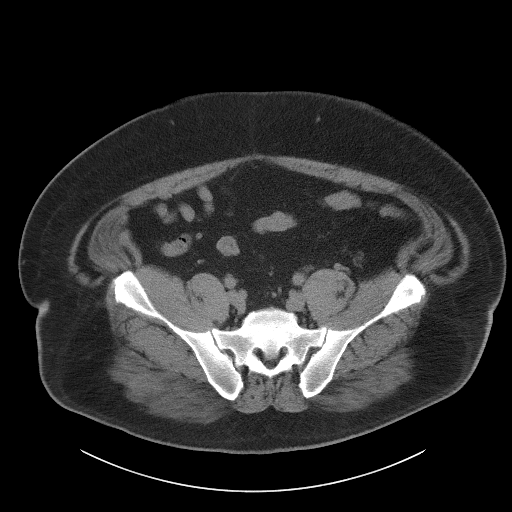
[im 47/112  soft-tissue]
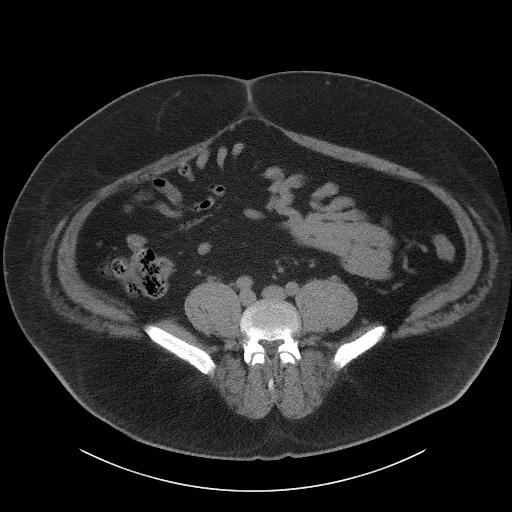
[im 56/112  soft-tissue]
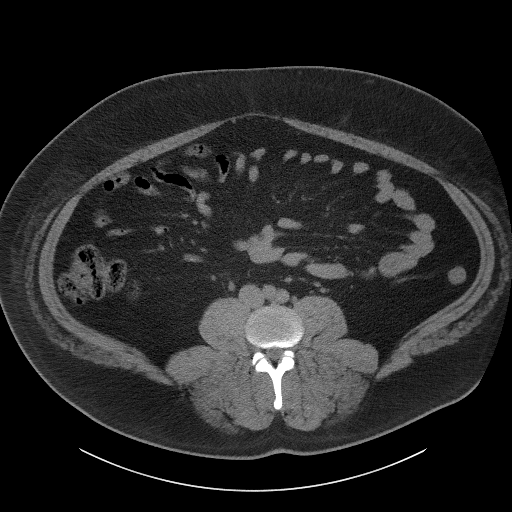
[im 65/112  soft-tissue]
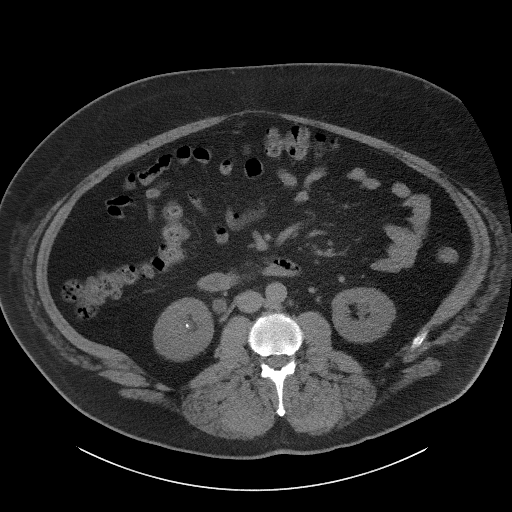
[im 75/112  soft-tissue]
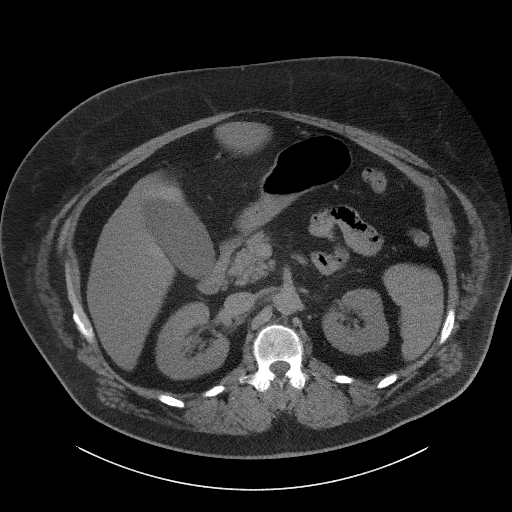
[im 75/112  bone]
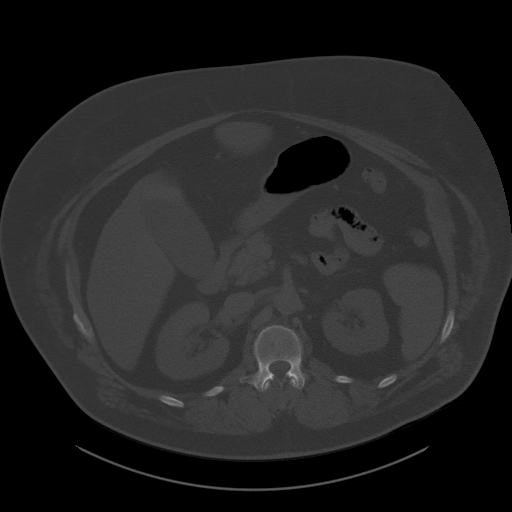
[im 79/112  soft-tissue]
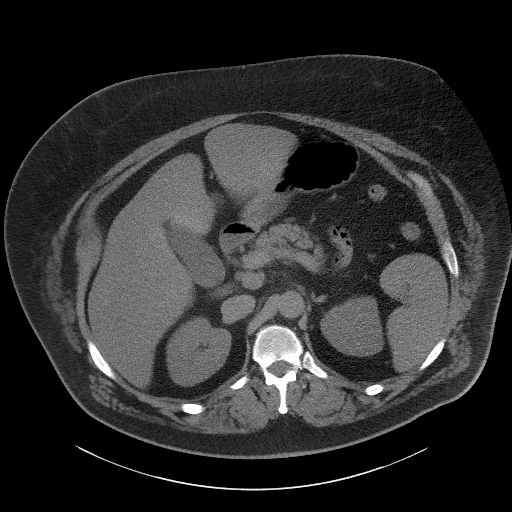
[im 88/112  soft-tissue]
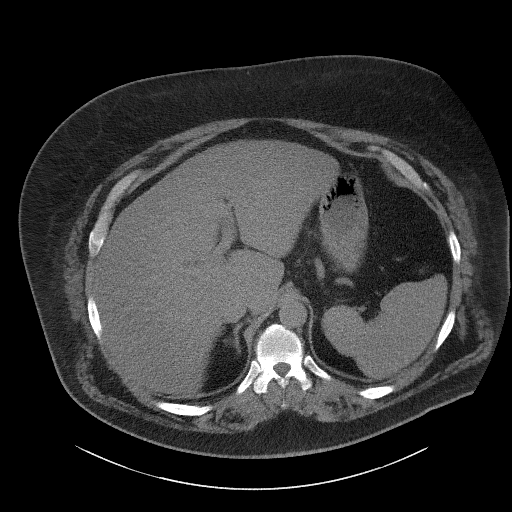
[im 98/112  soft-tissue]
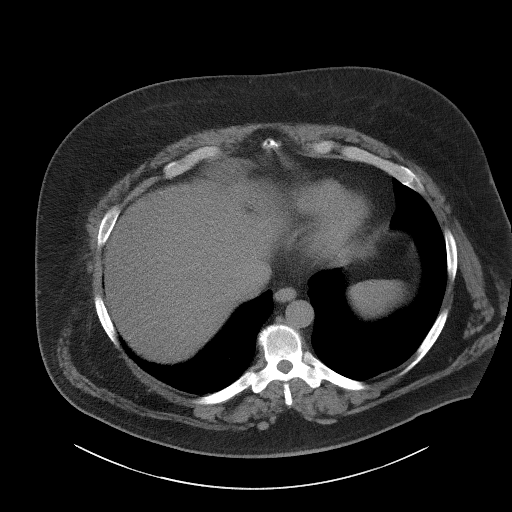
[im 107/112  soft-tissue]
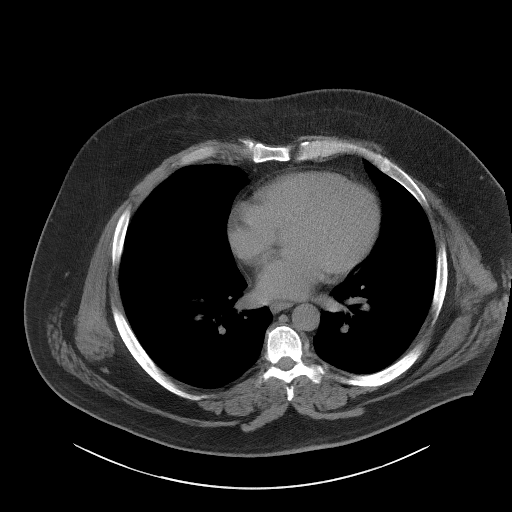

[Series 5: coronal · coronal · 0.99mm/px · 3 of 196 slices shown]
[im 66/196  soft-tissue]
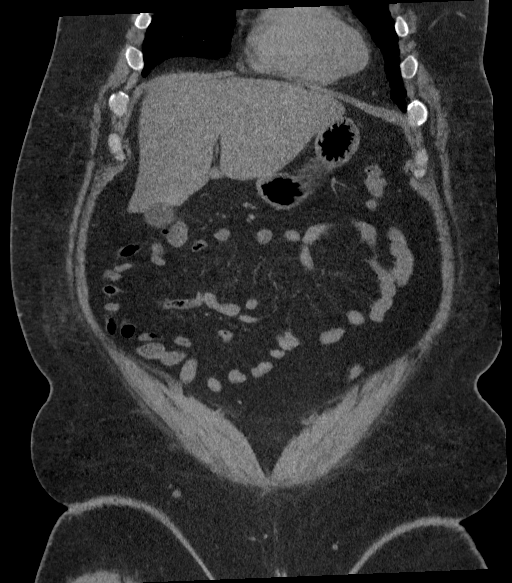
[im 87/196  soft-tissue]
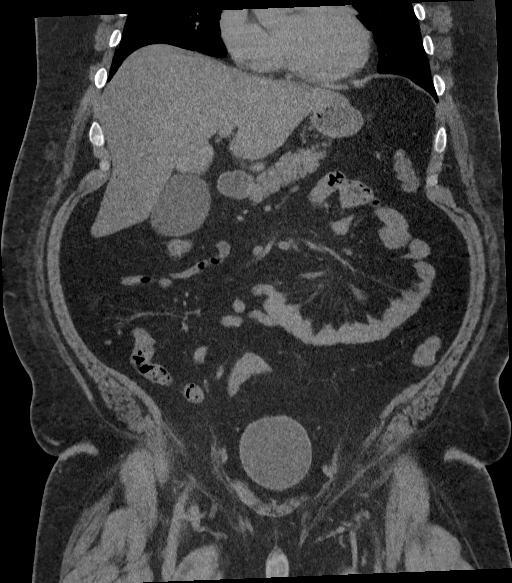
[im 109/196  soft-tissue]
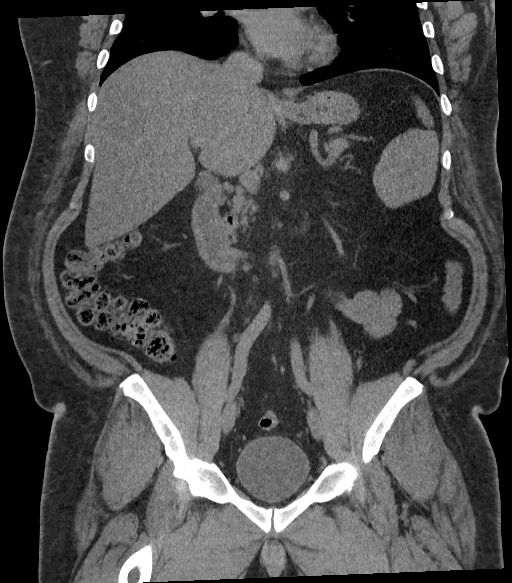

[16 of 46 positions shown; findings below may reference images not displayed]

FINDINGS: Lower chest: No acute abnormality.

Hepatobiliary: No focal liver abnormality is seen. No gallstones,
gallbladder wall thickening, or biliary dilatation.

Pancreas: Unremarkable. No pancreatic ductal dilatation or
surrounding inflammatory changes.

Spleen: Normal in size without focal abnormality.

Adrenals/Urinary Tract: Adrenal glands are within normal limits.
Scattered nonobstructing tiny renal calculi are noted bilaterally.
There are mild obstructive changes identified on the right extending
into the distal right ureter. No definitive ureteral stone is seen.
No calculus is noted within the bladder.

Stomach/Bowel: Stomach is within normal limits. Appendix appears
normal. No evidence of bowel wall thickening, distention, or
inflammatory changes.

Vascular/Lymphatic: Circumaortic left renal vein is noted. Mild
atherosclerotic calcifications of the aorta are seen without
aneurysmal dilatation. No significant lymphadenopathy is noted.

Reproductive: Prostate is unremarkable.

Other: No abdominal wall hernia or abnormality. No abdominopelvic
ascites.

Musculoskeletal: No acute or significant osseous findings.
IMPRESSION: Small nonobstructing renal calculi bilaterally.

Mild right-sided hydronephrosis and proximal hydroureter. These
changes may be related to edema from recently passed stone as no
definitive obstructing lesion is seen.

## 2020-01-05 NOTE — Telephone Encounter (Signed)
Pt seen 10/02/19 at UC.

## 2020-02-11 ENCOUNTER — Ambulatory Visit
Admission: EM | Admit: 2020-02-11 | Discharge: 2020-02-11 | Disposition: A | Discharge status: Home / Self Care | Payer: BC Managed Care – PPO | Attending: Urgent Care | Admitting: Urgent Care

## 2020-02-11 DIAGNOSIS — K047 Periapical abscess without sinus: Secondary | ICD-10-CM | POA: Diagnosis not present

## 2020-02-11 DIAGNOSIS — K0889 Other specified disorders of teeth and supporting structures: Secondary | ICD-10-CM

## 2020-02-11 MED ORDER — AMOXICILLIN-POT CLAVULANATE 875-125 MG PO TABS
1.0000 | ORAL_TABLET | Freq: Two times a day (BID) | ORAL | 0 refills | Status: DC
Start: 2020-02-11 — End: 2020-10-31

## 2020-02-11 MED ORDER — NAPROXEN 375 MG PO TABS
375.0000 mg | ORAL_TABLET | Freq: Two times a day (BID) | ORAL | 0 refills | Status: DC
Start: 2020-02-11 — End: 2022-01-10

## 2020-02-11 NOTE — ED Triage Notes (Signed)
Pt reports dental R upper tooth second from last tooth. Pain x 2 days.  Taking 1,000mg  of Ibuprofen every 4-5 hours.  Dentist unable to see him for a couple weeks.

## 2020-02-11 NOTE — Discharge Instructions (Addendum)
Please make sure you keep your appointment with the dentist.  Take the antibiotic amoxicillin clavulanate and naproxen (for pain and inflammation) twice daily with food.  If you get substantially worse including difficulty opening the mouth, controlling your saliva or severe fevers, chest pain then please report to the emergency room.  I am also providing you with a note for your job just in case you are not able to work given your pain and dental infection.

## 2020-02-11 NOTE — ED Provider Notes (Signed)
Renae Gloss   MRN: [id number] DOB: 05-24-1970  Subjective:   Alex Newman is a 49 y.o. male presenting for 2-day history of acute onset right upper dental pain and facial pain.  Patient contacted his dentist and made an appointment for 2 weeks from now.  He was advised to come here for antibiotics.  Patient has been using ibuprofen at heavy doses. Denies hx of heart disease, MI, kidney disease.  Patient is an uncontrolled diabetic.  He has been using 1000 mg of ibuprofen every 4-5 hours.  Last dosing was this morning.  Denies abdominal pain, diarrhea or bloody stools.  Has a history of penicillin allergy as a child but states that he cannot recall this at home.  He has taken amoxicillin twice recently for having his wisdom teeth extracted.  No current facility-administered medications for this encounter.  Current Outpatient Medications:  .  ACCU-CHEK AVIVA PLUS test strip, USE TO CHECK BLOOD SUGAR TWO TIMES A DAY, Disp: 50 strip, Rfl: 11 .  Accu-Chek Softclix Lancets lancets, USE TO CHECK BLOOD SUGAR TWO TIMES A DAY. DAY SUPPLY PER INSURANCE, Disp: 100 each, Rfl: 5 .  Blood Glucose Monitoring Suppl (ACCU-CHEK AVIVA PLUS) w/Device KIT, Use to check blood sugar two times a day, Disp: 1 kit, Rfl: 0 .  Lancets Misc. (ACCU-CHEK FASTCLIX LANCET) KIT, Use to check blood sugar two times a day, Disp: 1 kit, Rfl: 0 .  levothyroxine (SYNTHROID) 75 MCG tablet, TAKE 1 TABLET BY MOUTH EVERY DAY, Disp: 90 tablet, Rfl: 2 .  metFORMIN (GLUCOPHAGE-XR) 500 MG 24 hr tablet, TAKE 1 TABLET BY MOUTH EVERY DAY WITH BREAKFAST, Disp: 90 tablet, Rfl: 0 .  azithromycin (ZITHROMAX) 250 MG tablet, Take 1 tablet (250 mg total) by mouth daily. Take first 2 tablets together, then 1 every day until finished., Disp: 6 tablet, Rfl: 0 .  ondansetron (ZOFRAN-ODT) 8 MG disintegrating tablet, DISSOLVE ONE TABLET IN THE MOUTH EVERY 8HOURS AS NEEDED FOR NAUSEA AND VOMITING., Disp: 30 tablet, Rfl: 0 .  polyethylene  glycol (MIRALAX / GLYCOLAX) packet, Take 17 g by mouth daily. Mix one tablespoon with 8oz of your favorite juice or water every day until you are having soft formed stools. Then start taking once daily if you didn't have a stool the day before., Disp: 30 each, Rfl: 0 .  rosuvastatin (CRESTOR) 20 MG tablet, Take 1 tablet (20 mg total) by mouth daily., Disp: 90 tablet, Rfl: 3   Allergies  Allergen Reactions  . Cetirizine Hcl     REACTION: Headache  . Penicillins   . Propoxyphene N-Acetaminophen     REACTION: ha, nausea    Past Medical History:  Diagnosis Date  . Allergic rhinitis due to pollen   . GERD (gastroesophageal reflux disease) 02/11/2013  . Hyperlipidemia   . Hypothyroidism   . Kidney stone   . Morbid obesity with BMI of 45.0-49.9, adult ([place])   . Nephrolithiasis      Past Surgical History:  Procedure Laterality Date  . GANGLION CYST EXCISION Right    wrist x 2  . kidney stone removal     x2    Family History  Problem Relation Age of Onset  . Thyroid disease Mother   . Bipolar disorder Mother   . Heart disease Father   . Neuropathy Father   . Stroke Maternal Grandfather   . Lung cancer Maternal Grandmother     Social History   Tobacco Use  . Smoking status: Never Smoker  .  Smokeless tobacco: Never Used  Vaping Use  . Vaping Use: Never used  Substance Use Topics  . Alcohol use: No    Alcohol/week: 0.0 standard drinks  . Drug use: No    ROS   Objective:   Vitals: BP 128/86 (BP Location: Right Arm)   Pulse 83   Temp 98.9 F (37.2 C) (Oral)   Resp 18   SpO2 96%    Physical Exam Constitutional:      General: He is not in acute distress.    Appearance: Normal appearance. He is well-developed and normal weight. He is not ill-appearing, toxic-appearing or diaphoretic.  HENT:     Head: Normocephalic and atraumatic.      Right Ear: External ear normal.     Left Ear: External ear normal.     Nose: Nose normal.     Mouth/Throat:     Mouth:  Mucous membranes are moist.     Pharynx: No oropharyngeal exudate or posterior oropharyngeal erythema.      Comments: Missing posterior most molars from wisdom tooth extraction.   Eyes:     General: No scleral icterus.       Right eye: No discharge.        Left eye: No discharge.     Extraocular Movements: Extraocular movements intact.     Conjunctiva/sclera: Conjunctivae normal.     Pupils: Pupils are equal, round, and reactive to light.  Cardiovascular:     Rate and Rhythm: Normal rate.  Pulmonary:     Effort: Pulmonary effort is normal.  Musculoskeletal:     Cervical back: Normal range of motion and neck supple. No rigidity. No muscular tenderness.  Neurological:     General: No focal deficit present.     Mental Status: He is alert and oriented to person, place, and time.  Psychiatric:        Mood and Affect: Mood normal.        Behavior: Behavior normal.        Thought Content: Thought content normal.        Judgment: Judgment normal.      Assessment and Plan :   PDMP not reviewed this encounter.  1. Dental infection   2. Pain, dental     Counseled against using excessive amounts of ibuprofen.  Recommended switching to naproxen and will start patient on Augmentin for dental infection.  Maintain appointment with his dental surgeon. Counseled patient on potential for adverse effects with medications prescribed/recommended today, ER and return-to-clinic precautions discussed, patient verbalized understanding.    Jaynee Eagles, [place] 02/11/20 [phone]

## 2020-05-09 ENCOUNTER — Other Ambulatory Visit: Payer: Self-pay | Admitting: Family Medicine

## 2020-05-10 NOTE — Telephone Encounter (Signed)
Please schedule patient for diabetic follow up with Dr. Patsy Lager.  He was suppose to follow up in December.

## 2020-05-10 NOTE — Telephone Encounter (Signed)
Left voice message to call the office  

## 2020-05-16 NOTE — Telephone Encounter (Signed)
Left voice message to call the office  

## 2020-05-23 NOTE — Telephone Encounter (Signed)
Spoke with patient stated that he's working during the week will call back when he can make appointment

## 2020-06-13 ENCOUNTER — Other Ambulatory Visit: Payer: Self-pay

## 2020-06-13 ENCOUNTER — Ambulatory Visit
Admission: EM | Admit: 2020-06-13 | Discharge: 2020-06-13 | Disposition: A | Discharge status: Home / Self Care | Payer: BC Managed Care – PPO | Attending: Emergency Medicine | Admitting: Emergency Medicine

## 2020-06-13 DIAGNOSIS — M545 Low back pain, unspecified: Secondary | ICD-10-CM | POA: Diagnosis not present

## 2020-06-13 MED ORDER — IBUPROFEN 600 MG PO TABS: 600.0000 mg | ORAL_TABLET | Freq: Four times a day (QID) | ORAL | 0 refills | Status: AC | PRN

## 2020-06-13 MED ORDER — CYCLOBENZAPRINE HCL 10 MG PO TABS
10.0000 mg | ORAL_TABLET | Freq: Two times a day (BID) | ORAL | 0 refills | Status: DC | PRN
Start: 2020-06-13 — End: 2023-11-20

## 2020-06-13 NOTE — ED Provider Notes (Signed)
Alex Newman    CSN: [id number] Arrival date & time: 06/13/20  1194      History   Chief Complaint Chief Complaint  Patient presents with  . Back Pain    HPI Alex Newman is a 50 y.o. male.   Patient presents with left low back pain x4 days.  He attempted to raise a heavy door at work on 06/10/2020.  He has been having back pain since then.  The pain is nonradiating; worse with standing and ambulation; improves with rest.  He denies fever, chills, abdominal pain, dysuria, numbness, weakness, paresthesias, saddle anesthesia, loss of bowel/bladder control, or other symptoms.  Treatment attempted at home with Tylenol.  His medical history includes morbid obesity, lumbar back pain, kidney stones, hypothyroidism, GERD, allergic rhinitis, hyperlipidemia.  The history is provided by the patient and medical records.    Past Medical History:  Diagnosis Date  . Allergic rhinitis due to pollen   . GERD (gastroesophageal reflux disease) 02/11/2013  . Hyperlipidemia   . Hypothyroidism   . Kidney stone   . Morbid obesity with BMI of 45.0-49.9, adult ([place])   . Nephrolithiasis     Patient Active Problem List   Diagnosis Date Noted  . GERD (gastroesophageal reflux disease) 02/11/2013  . MORBID OBESITY 05/18/2010  . BACK PAIN, LUMBAR 09/29/2007  . HYPERCHOLESTEROLEMIA 07/10/2007  . ALLERGIC RHINITIS 07/10/2007  . RENAL CALCULUS 07/10/2007  . Hypothyroidism, acquired 07/10/2007    Past Surgical History:  Procedure Laterality Date  . GANGLION CYST EXCISION Right    wrist x 2  . kidney stone removal     x2       Home Medications    Prior to Admission medications   Medication Sig Start Date End Date Taking? Authorizing Provider  cyclobenzaprine (FLEXERIL) 10 MG tablet Take 1 tablet (10 mg total) by mouth 2 (two) times daily as needed for muscle spasms. 06/13/20  Yes Sharion Balloon, NP  ibuprofen (ADVIL) 600 MG tablet Take 1 tablet (600 mg total) by mouth every 6 (six)  hours as needed. 06/13/20  Yes Sharion Balloon, NP  ACCU-CHEK AVIVA PLUS test strip USE TO CHECK BLOOD SUGAR TWO TIMES A DAY 08/26/19   Copland, Frederico Hamman, MD  Accu-Chek Softclix Lancets lancets USE TO CHECK BLOOD SUGAR TWO TIMES A DAY. DAY SUPPLY PER INSURANCE 01/26/19   Copland, Frederico Hamman, MD  amoxicillin-clavulanate (AUGMENTIN) 875-125 MG tablet Take 1 tablet by mouth every 12 (twelve) hours. 02/11/20   Jaynee Eagles, PA-C  azithromycin (ZITHROMAX) 250 MG tablet Take 1 tablet (250 mg total) by mouth daily. Take first 2 tablets together, then 1 every day until finished. 10/02/19   Sharion Balloon, NP  Blood Glucose Monitoring Suppl (ACCU-CHEK AVIVA PLUS) w/Device KIT Use to check blood sugar two times a day 08/14/17   Copland, Frederico Hamman, MD  Lancets Misc. (ACCU-CHEK FASTCLIX LANCET) KIT Use to check blood sugar two times a day 08/14/17   Copland, Frederico Hamman, MD  levothyroxine (SYNTHROID) 75 MCG tablet TAKE 1 TABLET BY MOUTH EVERY DAY 11/20/19   Copland, Frederico Hamman, MD  metFORMIN (GLUCOPHAGE-XR) 500 MG 24 hr tablet TAKE 1 TABLET BY MOUTH EVERY DAY WITH BREAKFAST 05/10/20   Copland, Frederico Hamman, MD  naproxen (NAPROSYN) 375 MG tablet Take 1 tablet (375 mg total) by mouth 2 (two) times daily with a meal. Do not take with ibuprofen. 02/11/20   Jaynee Eagles, PA-C  ondansetron (ZOFRAN-ODT) 8 MG disintegrating tablet DISSOLVE ONE TABLET IN THE MOUTH EVERY 8HOURS AS  NEEDED FOR NAUSEA AND VOMITING. 11/09/18   Copland, Frederico Hamman, MD  polyethylene glycol (MIRALAX / GLYCOLAX) packet Take 17 g by mouth daily. Mix one tablespoon with 8oz of your favorite juice or water every day until you are having soft formed stools. Then start taking once daily if you didn't have a stool the day before. 03/25/18   Merlyn Lot, MD  rosuvastatin (CRESTOR) 20 MG tablet Take 1 tablet (20 mg total) by mouth daily. 09/17/18   Owens Loffler, MD    Family History Family History  Problem Relation Age of Onset  . Thyroid disease Mother   . Bipolar disorder Mother    . Heart disease Father   . Neuropathy Father   . Stroke Maternal Grandfather   . Lung cancer Maternal Grandmother     Social History Social History   Tobacco Use  . Smoking status: Never Smoker  . Smokeless tobacco: Never Used  Vaping Use  . Vaping Use: Never used  Substance Use Topics  . Alcohol use: No    Alcohol/week: 0.0 standard drinks  . Drug use: No     Allergies   Cetirizine hcl, Penicillins, and Propoxyphene n-acetaminophen   Review of Systems Review of Systems  Constitutional: Negative for chills and fever.  HENT: Negative for ear pain and sore throat.   Eyes: Negative for pain and visual disturbance.  Respiratory: Negative for cough and shortness of breath.   Cardiovascular: Negative for chest pain and palpitations.  Gastrointestinal: Negative for abdominal pain and vomiting.  Genitourinary: Negative for dysuria and hematuria.  Musculoskeletal: Positive for back pain. Negative for arthralgias.  Skin: Negative for color change and rash.  Neurological: Negative for syncope, weakness and numbness.  All other systems reviewed and are negative.    Physical Exam Triage Vital Signs ED Triage Vitals  Enc Vitals Group     BP      Pulse      Resp      Temp      Temp src      SpO2      Weight      Height      Head Circumference      Peak Flow      Pain Score      Pain Loc      Pain Edu?      Excl. in [place]?    No data found.  Updated Vital Signs BP (!) 144/85 (BP Location: Left Arm)   Pulse 78   Temp 98.7 F (37.1 C) (Oral)   Resp 18   SpO2 96%   Visual Acuity Right Eye Distance:   Left Eye Distance:   Bilateral Distance:    Right Eye Near:   Left Eye Near:    Bilateral Near:     Physical Exam Vitals and nursing note reviewed.  Constitutional:      General: He is not in acute distress.    Appearance: He is well-developed. He is obese.  HENT:     Head: Normocephalic and atraumatic.     Mouth/Throat:     Mouth: Mucous membranes are  moist.  Eyes:     Conjunctiva/sclera: Conjunctivae normal.  Cardiovascular:     Rate and Rhythm: Normal rate and regular rhythm.     Heart sounds: Normal heart sounds.  Pulmonary:     Effort: Pulmonary effort is normal. No respiratory distress.     Breath sounds: Normal breath sounds.  Abdominal:     Palpations: Abdomen is soft.  Tenderness: There is no abdominal tenderness. There is no right CVA tenderness, left CVA tenderness, guarding or rebound.  Musculoskeletal:        General: Normal range of motion.     Cervical back: Neck supple.  Skin:    General: Skin is warm and dry.     Capillary Refill: Capillary refill takes less than 2 seconds.     Findings: No bruising, erythema, lesion or rash.  Neurological:     General: No focal deficit present.     Mental Status: He is alert and oriented to person, place, and time.     Sensory: No sensory deficit.     Motor: No weakness.     Gait: Gait abnormal.     Comments: Negative straight leg raise. Slow gait, holding left lower back.   Psychiatric:        Mood and Affect: Mood normal.        Behavior: Behavior normal.      UC Treatments / Results  Labs (all labs ordered are listed, but only abnormal results are displayed) Labs Reviewed - No data to display  EKG   Radiology No results found.  Procedures Procedures (including critical care time)  Medications Ordered in UC Medications - No data to display  Initial Impression / Assessment and Plan / UC Course  I have reviewed the triage vital signs and the nursing notes.  Pertinent labs & imaging results that were available during my care of the patient were reviewed by me and considered in my medical decision making (see chart for details).   Acute left lower back pain without sciatica.  Treating with ibuprofen and Flexeril.  Precautions for drowsiness with Flexeril discussed.  Instructed patient to follow-up with his PCP or an orthopedist if his symptoms are not  improving.  He agrees to plan of care.   Final Clinical Impressions(s) / UC Diagnoses   Final diagnoses:  Acute left-sided low back pain without sciatica     Discharge Instructions     Take ibuprofen as needed for discomfort.  Take the muscle relaxer as needed for muscle spasm; Do not drive, operate machinery, or drink alcohol with this medication as it can cause drowsiness.   Follow up with your primary care provider or an orthopedist if your symptoms are not improving.        ED Prescriptions    Medication Sig Dispense Auth. Provider   ibuprofen (ADVIL) 600 MG tablet Take 1 tablet (600 mg total) by mouth every 6 (six) hours as needed. 30 tablet Sharion Balloon, NP   cyclobenzaprine (FLEXERIL) 10 MG tablet Take 1 tablet (10 mg total) by mouth 2 (two) times daily as needed for muscle spasms. 20 tablet Sharion Balloon, NP     I have reviewed the PDMP during this encounter.   Sharion Balloon, NP 06/13/20 [phone]

## 2020-06-13 NOTE — ED Triage Notes (Signed)
Patient presents to Urgent Care with complaints of back pain that started Friday from attempting to lift a 400 lb door. He reports from back injury he has been having sharp pain localized to left lower back area. Treating pain 500 pain reliever yesterday.

## 2020-06-13 NOTE — Discharge Instructions (Addendum)
Take ibuprofen as needed for discomfort.  Take the muscle relaxer as needed for muscle spasm; Do not drive, operate machinery, or drink alcohol with this medication as it can cause drowsiness.   Follow up with your primary care provider or an orthopedist if your symptoms are not improving.     

## 2020-08-10 ENCOUNTER — Other Ambulatory Visit: Payer: Self-pay | Admitting: Family Medicine

## 2020-08-10 NOTE — Telephone Encounter (Signed)
Please schedule CPE with fasting labs for sometime after 08/25/2020 with Dr. Patsy Lager.  Send back to me once scheduled to refill his medication.

## 2020-08-11 NOTE — Telephone Encounter (Signed)
Left voice message to call the office  

## 2020-08-18 NOTE — Telephone Encounter (Signed)
Left voice message to call the office  

## 2020-08-23 NOTE — Telephone Encounter (Signed)
Left voice message to call the office  

## 2020-08-29 NOTE — Telephone Encounter (Signed)
Left voice message to call the office . Letter sent  

## 2020-09-07 NOTE — Telephone Encounter (Signed)
Spoke with stated unable to get time off work . Will call back to make appointment

## 2020-09-11 ENCOUNTER — Other Ambulatory Visit: Payer: Self-pay | Admitting: Family Medicine

## 2020-09-12 ENCOUNTER — Telehealth: Payer: Self-pay | Admitting: Family Medicine

## 2020-09-12 NOTE — Telephone Encounter (Signed)
Please schedule CPE with fasting labs prior with Dr. Copland.  

## 2020-09-12 NOTE — Telephone Encounter (Signed)
error 

## 2020-09-12 NOTE — Telephone Encounter (Signed)
Called pt to schedule appts.Pt stated that he is not able to make appt because his manager is out sick and he cannot leave the office because he is the only person there until he gets back. Pt stated that he can only do appts on July 28 and 29 of this month. Pt also stated that he is out of metformin, has four left of his test strips and  four or five left of his lancet. Pt stated that he can only do first thing in the morning or late afternoon.

## 2020-09-24 ENCOUNTER — Other Ambulatory Visit: Payer: Self-pay | Admitting: Family Medicine

## 2020-09-24 DIAGNOSIS — E119 Type 2 diabetes mellitus without complications: Secondary | ICD-10-CM

## 2020-09-28 ENCOUNTER — Telehealth: Payer: Self-pay

## 2020-09-28 NOTE — Telephone Encounter (Signed)
Sending note to [facility] support for appt.

## 2020-09-28 NOTE — Telephone Encounter (Signed)
[facility] Night - Client Nonclinical Telephone Record Recruitment consultant [facility] Night - Client Client Site [facility] - Night Physician Hannah Beat - MD Contact Type Call Who Is Calling Patient / Member / Family / Caregiver Caller Name Nicodemus Denk Caller Phone Number [phone] Patient Name Alex Newman Patient DOB [birth year] Call Type Message Only Information Provided Reason for Call Request to Reschedule Office Appointment Initial Comment Caller got a text about an appt at 920. He never got an appt confirmation call. He will not be able to make it today. Patient request to speak to RN No Additional Comment Provided office hours. Disp. Time Disposition Final User 09/28/2020 7:37:54 AM General Information Provided Yes Lajoyce Lauber Call Closed By: Lajoyce Lauber Transaction Date/Time: 09/28/2020 7:34:44 AM (ET)

## 2020-09-29 ENCOUNTER — Encounter: Payer: BC Managed Care – PPO | Admitting: Family Medicine

## 2020-10-31 ENCOUNTER — Ambulatory Visit (INDEPENDENT_AMBULATORY_CARE_PROVIDER_SITE_OTHER): Payer: BC Managed Care – PPO | Admitting: Family Medicine

## 2020-10-31 ENCOUNTER — Encounter: Payer: Self-pay | Admitting: Family Medicine

## 2020-10-31 ENCOUNTER — Other Ambulatory Visit: Payer: Self-pay

## 2020-10-31 VITALS — BP 130/86 | HR 85 | Temp 98.7°F | Ht 70.5 in | Wt 336.2 lb

## 2020-10-31 DIAGNOSIS — E7800 Pure hypercholesterolemia, unspecified: Secondary | ICD-10-CM

## 2020-10-31 DIAGNOSIS — E119 Type 2 diabetes mellitus without complications: Secondary | ICD-10-CM | POA: Insufficient documentation

## 2020-10-31 DIAGNOSIS — E039 Hypothyroidism, unspecified: Secondary | ICD-10-CM | POA: Diagnosis not present

## 2020-10-31 DIAGNOSIS — Z79899 Other long term (current) drug therapy: Secondary | ICD-10-CM | POA: Diagnosis not present

## 2020-10-31 DIAGNOSIS — Z0000 Encounter for general adult medical examination without abnormal findings: Secondary | ICD-10-CM

## 2020-10-31 DIAGNOSIS — Z1211 Encounter for screening for malignant neoplasm of colon: Secondary | ICD-10-CM

## 2020-10-31 DIAGNOSIS — Z125 Encounter for screening for malignant neoplasm of prostate: Secondary | ICD-10-CM

## 2020-10-31 LAB — CBC WITH DIFFERENTIAL/PLATELET
Basophils Absolute: 0 10*3/uL (ref 0.0–0.1)
Basophils Relative: 0.3 % (ref 0.0–3.0)
Eosinophils Absolute: 0 10*3/uL (ref 0.0–0.7)
Eosinophils Relative: 0.4 % (ref 0.0–5.0)
HCT: 46.4 % (ref 39.0–52.0)
Hemoglobin: 15.6 g/dL (ref 13.0–17.0)
Lymphocytes Relative: 26.3 % (ref 12.0–46.0)
Lymphs Abs: 1.9 10*3/uL (ref 0.7–4.0)
MCHC: 33.6 g/dL (ref 30.0–36.0)
MCV: 87.3 fl (ref 78.0–100.0)
Monocytes Absolute: 0.4 10*3/uL (ref 0.1–1.0)
Monocytes Relative: 5.1 % (ref 3.0–12.0)
Neutro Abs: 4.9 10*3/uL (ref 1.4–7.7)
Neutrophils Relative %: 67.9 % (ref 43.0–77.0)
Platelets: 175 10*3/uL (ref 150.0–400.0)
RBC: 5.32 Mil/uL (ref 4.22–5.81)
RDW: 14.6 % (ref 11.5–15.5)
WBC: 7.2 10*3/uL (ref 4.0–10.5)

## 2020-10-31 LAB — MICROALBUMIN / CREATININE URINE RATIO
Creatinine,U: 106.4 mg/dL
Microalb Creat Ratio: 3.4 mg/g (ref 0.0–30.0)
Microalb, Ur: 3.6 mg/dL — ABNORMAL HIGH (ref 0.0–1.9)

## 2020-10-31 LAB — LIPID PANEL
Cholesterol: 208 mg/dL — ABNORMAL HIGH (ref 0–200)
HDL: 32.4 mg/dL — ABNORMAL LOW (ref >39.00)
LDL Cholesterol: 141 mg/dL — ABNORMAL HIGH (ref 0–99)
NonHDL: 175.23
Total CHOL/HDL Ratio: 6
Triglycerides: 170 mg/dL — ABNORMAL HIGH (ref 0.0–149.0)
VLDL: 34 mg/dL (ref 0.0–40.0)

## 2020-10-31 LAB — BASIC METABOLIC PANEL
BUN: 10 mg/dL (ref 6–23)
CO2: 26 mEq/L (ref 19–32)
Calcium: 9.5 mg/dL (ref 8.4–10.5)
Chloride: 104 mEq/L (ref 96–112)
Creatinine, Ser: 0.88 mg/dL (ref 0.40–1.50)
GFR: 99.56 mL/min (ref >60.00)
Glucose, Bld: 148 mg/dL — ABNORMAL HIGH (ref 70–99)
Potassium: 4.6 mEq/L (ref 3.5–5.1)
Sodium: 140 mEq/L (ref 135–145)

## 2020-10-31 LAB — HEPATIC FUNCTION PANEL
ALT: 35 U/L (ref 0–53)
AST: 29 U/L (ref 0–37)
Albumin: 4.1 g/dL (ref 3.5–5.2)
Alkaline Phosphatase: 115 U/L (ref 39–117)
Bilirubin, Direct: 0.1 mg/dL (ref 0.0–0.3)
Total Bilirubin: 0.8 mg/dL (ref 0.2–1.2)
Total Protein: 7.1 g/dL (ref 6.0–8.3)

## 2020-10-31 LAB — HEMOGLOBIN A1C: Hgb A1c MFr Bld: 8.1 % — ABNORMAL HIGH (ref 4.6–6.5)

## 2020-10-31 LAB — TSH: TSH: 1.87 u[IU]/mL (ref 0.35–5.50)

## 2020-10-31 LAB — T3, FREE: T3, Free: 3.3 pg/mL (ref 2.3–4.2)

## 2020-10-31 LAB — T4, FREE: Free T4: 0.84 ng/dL (ref 0.60–1.60)

## 2020-10-31 MED ORDER — ONDANSETRON 8 MG PO TBDP: ORAL_TABLET | ORAL | 1 refills | Status: DC

## 2020-10-31 MED ORDER — METFORMIN HCL ER 500 MG PO TB24: ORAL_TABLET | ORAL | 3 refills | Status: DC

## 2020-10-31 MED ORDER — LEVOTHYROXINE SODIUM 75 MCG PO TABS: 75.0000 ug | ORAL_TABLET | Freq: Every day | ORAL | 3 refills | Status: DC

## 2020-10-31 NOTE — Progress Notes (Signed)
Alex T. Copland, MD, [place] at [facility] [place], [address]  Phone: [phone]  FAX: [place] - 50 y.o. male  MRN [id number]  Date of Birth: 16-Sep-1970  Date: 10/31/2020  PCP: Owens Loffler, MD  Referral: Owens Loffler, MD  Chief Complaint  Patient presents with   Annual Exam    This visit occurred during the SARS-CoV-2 public health emergency.  Safety protocols were in place, including screening questions prior to the visit, additional usage of staff PPE, and extensive cleaning of exam room while observing appropriate contact time as indicated for disinfecting solutions.   Patient Care Team: Owens Loffler, MD as PCP - General Subjective:   NAM Alex Newman is a 50 y.o. pleasant patient who presents with the following:  Preventative Health Maintenance Visit:  Health Maintenance Summary Reviewed and updated, unless pt declines services.  Tobacco History Reviewed. Alcohol: No concerns, no excessive use Exercise Habits: Some activity, rec at least 30 mins 5 times a week, but he is limited secondary to habitus, knee pain, and back pain. STD concerns: no risk or activity to increase risk Drug Use: None  Ended up in the ER.  Question sinusitis.  Has DM.  No a1c in 15 months Had been out of metformin 160, 108, then at the pinky 191 - 3 weeks out of his metformin  Very behind.  He declines all vaccines. Prevnar-20 Covid Shingles Tdap Flu  Colon ca screening  He does have ongoing chronic complaints of bilateral knee pain and back pain.  Limited with exercise  Was over 370 at one point  Lab Results  Component Value Date   HGBA1C 7.7 (A) 08/26/2019    Body mass index is 47.56 kg/m.   Health Maintenance  Topic Date Due   PNEUMOCOCCAL POLYSACCHARIDE VACCINE AGE 15-64 HIGH RISK  Never done   COVID-19 Vaccine (1) Never done   Pneumococcal Vaccine 15-56 Years old  (1 - PCV) Never done   FOOT EXAM  Never done   OPHTHALMOLOGY EXAM  Never done   HIV Screening  Never done   Hepatitis C Screening  Never done   TETANUS/TDAP  Never done   Zoster Vaccines- Shingrix (1 of 2) Never done   COLONOSCOPY (Pts 45-53yr Insurance coverage will need to be confirmed)  Never done   HEMOGLOBIN A1C  02/25/2020   URINE MICROALBUMIN  08/25/2020   INFLUENZA VACCINE  06/02/2021 (Originally 10/03/2020)   HPV VACCINES  Aged Out    There is no immunization history on file for this patient. Patient Active Problem List   Diagnosis Date Noted   Diabetes mellitus without complication ([place] 021/30/8657  GERD (gastroesophageal reflux disease) 02/11/2013   MORBID OBESITY 05/18/2010   BACK PAIN, LUMBAR 09/29/2007   HYPERCHOLESTEROLEMIA 07/10/2007   ALLERGIC RHINITIS 07/10/2007   RENAL CALCULUS 07/10/2007   Hypothyroidism, acquired 07/10/2007    Past Medical History:  Diagnosis Date   Allergic rhinitis due to pollen    GERD (gastroesophageal reflux disease) 02/11/2013   Hyperlipidemia    Hypothyroidism    Kidney stone    Morbid obesity with BMI of 45.0-49.9, adult ([place]    Nephrolithiasis     Past Surgical History:  Procedure Laterality Date   GANGLION CYST EXCISION Right    wrist x 2   kidney stone removal     x2    Family History  Problem Relation Age of Onset   Thyroid disease  Mother    Bipolar disorder Mother    Heart disease Father    Neuropathy Father    Stroke Maternal Grandfather    Lung cancer Maternal Grandmother     Past Medical History, Surgical History, Social History, Family History, Problem List, Medications, and Allergies have been reviewed and updated if relevant.  Review of Systems: Pertinent positives are listed above.  Otherwise, a full 14 point review of systems has been done in full and it is negative except where it is noted positive.  Objective:   BP 130/86   Pulse 85   Temp 98.7 F (37.1 C) (Temporal)   Ht 5' 10.5" (1.791 m)    Wt (!) 336 lb 4 oz (152.5 kg)   SpO2 96%   BMI 47.56 kg/m  Ideal Body Weight: Weight in (lb) to have BMI = 25: 176.4  Ideal Body Weight: Weight in (lb) to have BMI = 25: 176.4 No results found. Depression screen [facility] 2/9 10/31/2020 09/17/2018 08/14/2017 08/29/2015  Decreased Interest 0 0 1 0  Down, Depressed, Hopeless 0 0 0 1  PHQ - 2 Score 0 0 1 1     GEN: well developed, well nourished, no acute distress Eyes: conjunctiva and lids normal, PERRLA, EOMI ENT: TM clear, nares clear, oral exam WNL Neck: supple, no lymphadenopathy, no thyromegaly, no JVD Pulm: clear to auscultation and percussion, respiratory effort normal CV: regular rate and rhythm, S1-S2, no murmur, rub or gallop, no bruits, peripheral pulses normal and symmetric, no cyanosis, clubbing, edema or varicosities GI: soft, non-tender; no hepatosplenomegaly, masses; active bowel sounds all quadrants GU: deferred Lymph: no cervical, axillary or inguinal adenopathy MSK: gait normal, muscle tone and strength WNL, no joint swelling, effusions, discoloration, crepitus  SKIN: clear, good turgor, color WNL, no rashes, lesions, or ulcerations Neuro: normal mental status, normal strength, sensation, and motion Psych: alert; oriented to person, place and time, normally interactive and not anxious or depressed in appearance.  All labs reviewed with patient. Results for orders placed or performed during the hospital encounter of 10/06/19  Urine culture   Specimen: Urine, Random  Result Value Ref Range   Specimen Description      URINE, RANDOM Performed at [facility], [address] [place], [place] [address]    Special Requests      NONE Performed at [facility], [place],  [address]    Culture 80,000 COLONIES/mL STAPHYLOCOCCUS AUREUS (A)    Report Status 10/08/2019 FINAL    Organism ID, Bacteria STAPHYLOCOCCUS AUREUS (A)       Susceptibility   Staphylococcus aureus - MIC*     CIPROFLOXACIN <=0.5 SENSITIVE Sensitive     GENTAMICIN <=0.5 SENSITIVE Sensitive     NITROFURANTOIN <=16 SENSITIVE Sensitive     OXACILLIN <=0.25 SENSITIVE Sensitive     TETRACYCLINE <=1 SENSITIVE Sensitive     VANCOMYCIN 1 SENSITIVE Sensitive     TRIMETH/SULFA <=10 SENSITIVE Sensitive     CLINDAMYCIN <=0.25 SENSITIVE Sensitive     RIFAMPIN <=0.5 SENSITIVE Sensitive     Inducible Clindamycin NEGATIVE Sensitive     * 80,000 COLONIES/mL STAPHYLOCOCCUS AUREUS  Lipase, blood  Result Value Ref Range   Lipase 36 11 - 51 U/L  Comprehensive metabolic panel  Result Value Ref Range   Sodium 143 135 - 145 mmol/L   Potassium 3.2 (L) 3.5 - 5.1 mmol/L   Chloride 105 98 - 111 mmol/L   CO2 26 22 - 32 mmol/L   Glucose,  Bld 187 (H) 70 - 99 mg/dL   BUN 9 6 - 20 mg/dL   Creatinine, Ser 0.96 0.61 - 1.24 mg/dL   Calcium 9.4 8.9 - 10.3 mg/dL   Total Protein 7.8 6.5 - 8.1 g/dL   Albumin 3.9 3.5 - 5.0 g/dL   AST 38 15 - 41 U/L   ALT 38 0 - 44 U/L   Alkaline Phosphatase 117 38 - 126 U/L   Total Bilirubin 1.2 0.3 - 1.2 mg/dL   GFR calc non Af Amer >60 >60 mL/min   GFR calc Af Amer >60 >60 mL/min   Anion gap 12 5 - 15  CBC  Result Value Ref Range   WBC 4.7 4.0 - 10.5 K/uL   RBC 5.53 4.22 - 5.81 MIL/uL   Hemoglobin 16.0 13.0 - 17.0 g/dL   HCT 48.0 39.0 - 52.0 %   MCV 86.8 80.0 - 100.0 fL   MCH 28.9 26.0 - 34.0 pg   MCHC 33.3 30.0 - 36.0 g/dL   RDW 14.4 11.5 - 15.5 %   Platelets 184 150 - 400 K/uL   nRBC 0.0 0.0 - 0.2 %  Urinalysis, Complete w Microscopic  Result Value Ref Range   Color, Urine YELLOW (A) YELLOW   APPearance HAZY (A) CLEAR   Specific Gravity, Urine 1.013 1.005 - 1.030   pH 6.0 5.0 - 8.0   Glucose, UA NEGATIVE NEGATIVE mg/dL   Hgb urine dipstick SMALL (A) NEGATIVE   Bilirubin Urine NEGATIVE NEGATIVE   Ketones, ur NEGATIVE NEGATIVE mg/dL   Protein, ur 30 (A) NEGATIVE mg/dL   Nitrite NEGATIVE NEGATIVE   Leukocytes,Ua MODERATE (A) NEGATIVE   RBC / HPF 0-5 0 - 5 RBC/hpf   WBC,  UA >50 (H) 0 - 5 WBC/hpf   Bacteria, UA NONE SEEN NONE SEEN   Squamous Epithelial / LPF NONE SEEN 0 - 5   Mucus PRESENT    Sperm, UA PRESENT     Assessment and Plan:     ICD-10-CM   1. Healthcare maintenance  Z00.00     2. Colon cancer screening  Z12.11 Ambulatory referral to Gastroenterology    3. Hypothyroidism, acquired  E03.9 T4, free    T3, free    TSH    4. Diabetes mellitus without complication (HCC)  T51.7 Basic metabolic panel    Hemoglobin A1c    Microalbumin / creatinine urine ratio    5. HYPERCHOLESTEROLEMIA  E78.00 Lipid panel    6. Screening for malignant neoplasm of prostate  Z12.5 PSA, Total with Reflex to PSA, Free    7. Encounter for long-term current use of medication  Z79.899 CBC with Differential/Platelet    Hepatic function panel     Challenging case in that the patient declines the majority of health maintenance recommendations.  The patient declines routine health maintenance services noted. We reviewed that could lead to missing significant problems that could affect there mortality. The patient indicated that they understood this and was willing to accept those risks.   Check all basic labs.  Diabetes, reassess.  Change medication as needed.  Hypothyroidism, reassess.  He has not missed any levothyroxine.  Health Maintenance Exam: The patient's preventative maintenance and recommended screening tests for an annual wellness exam were reviewed in full today. Brought up to date unless services declined.  Counselled on the importance of diet, exercise, and its role in overall health and mortality. The patient's FH and SH was reviewed, including their home life, tobacco status, and drug  and alcohol status.  Follow-up in 1 year for physical exam or additional follow-up below.  Follow-up: Return in about 6 months (around 05/02/2021). Or follow-up in 1 year if not noted.  Meds ordered this encounter  Medications   ondansetron (ZOFRAN-ODT) 8 MG  disintegrating tablet    Sig: DISSOLVE ONE TABLET IN THE MOUTH EVERY 8HOURS AS NEEDED FOR NAUSEA AND VOMITING.    Dispense:  30 tablet    Refill:  1   metFORMIN (GLUCOPHAGE-XR) 500 MG 24 hr tablet    Sig: TAKE 1 TABLET BY MOUTH EVERY DAY WITH BREAKFAST    Dispense:  90 tablet    Refill:  3   levothyroxine (SYNTHROID) 75 MCG tablet    Sig: Take 1 tablet (75 mcg total) by mouth daily.    Dispense:  90 tablet    Refill:  3   Medications Discontinued During This Encounter  Medication Reason   amoxicillin-clavulanate (AUGMENTIN) 875-125 MG tablet Completed Course   azithromycin (ZITHROMAX) 250 MG tablet Completed Course   rosuvastatin (CRESTOR) 20 MG tablet Side effect (s)   polyethylene glycol (MIRALAX / GLYCOLAX) packet Completed Course   ondansetron (ZOFRAN-ODT) 8 MG disintegrating tablet Reorder   metFORMIN (GLUCOPHAGE-XR) 500 MG 24 hr tablet Reorder   levothyroxine (SYNTHROID) 75 MCG tablet Reorder   Orders Placed This Encounter  Procedures   Basic metabolic panel   CBC with Differential/Platelet   Hepatic function panel   Hemoglobin A1c   Lipid panel   T4, free   T3, free   PSA, Total with Reflex to PSA, Free   Microalbumin / creatinine urine ratio   TSH   Ambulatory referral to Gastroenterology    Signed,  Frederico Hamman T. Copland, MD   Allergies as of 10/31/2020       Reactions   Cetirizine Hcl    REACTION: Headache   Penicillins    Propoxyphene N-acetaminophen    REACTION: ha, nausea        Medication List        Accurate as of October 31, 2020  2:07 PM. If you have any questions, ask your nurse or doctor.          STOP taking these medications    amoxicillin-clavulanate 875-125 MG tablet Commonly known as: AUGMENTIN Stopped by: Owens Loffler, MD   azithromycin 250 MG tablet Commonly known as: ZITHROMAX Stopped by: Owens Loffler, MD   polyethylene glycol 17 g packet Commonly known as: MIRALAX / GLYCOLAX Stopped by: Owens Loffler, MD    rosuvastatin 20 MG tablet Commonly known as: Crestor Stopped by: Owens Loffler, MD       TAKE these medications    Accu-Chek Aviva Plus test strip Generic drug: glucose blood USE TO CHECK BLOOD SUGAR TWO TIMES A DAY   Accu-Chek Aviva Plus w/Device Kit Use to check blood sugar two times a day   Accu-Chek [employer] Kit Use to check blood sugar two times a day   Accu-Chek Soft[employer]ix Lancets lancets USE TO CHECK BLOOD SUGAR TWO TIMES A DAY. DAY SUPPLY PER INSURANCE   cyclobenzaprine 10 MG tablet Commonly known as: FLEXERIL Take 1 tablet (10 mg total) by mouth 2 (two) times daily as needed for muscle spasms.   ibuprofen 600 MG tablet Commonly known as: ADVIL Take 1 tablet (600 mg total) by mouth every 6 (six) hours as needed.   levothyroxine 75 MCG tablet Commonly known as: SYNTHROID Take 1 tablet (75 mcg total) by mouth daily.   metFORMIN 500 MG 24  hr tablet Commonly known as: GLUCOPHAGE-XR TAKE 1 TABLET BY MOUTH EVERY DAY WITH BREAKFAST   naproxen 375 MG tablet Commonly known as: NAPROSYN Take 1 tablet (375 mg total) by mouth 2 (two) times daily with a meal. Do not take with ibuprofen.   ondansetron 8 MG disintegrating tablet Commonly known as: ZOFRAN-ODT DISSOLVE ONE TABLET IN THE MOUTH EVERY 8HOURS AS NEEDED FOR NAUSEA AND VOMITING.

## 2020-11-01 ENCOUNTER — Encounter: Payer: Self-pay | Admitting: *Deleted

## 2020-11-01 LAB — PSA, TOTAL WITH REFLEX TO PSA, FREE: PSA, Total: 1.2 ng/mL (ref <=4.0)

## 2021-02-20 ENCOUNTER — Telehealth (INDEPENDENT_AMBULATORY_CARE_PROVIDER_SITE_OTHER): Payer: BC Managed Care – PPO | Admitting: Family Medicine

## 2021-02-20 ENCOUNTER — Encounter: Payer: Self-pay | Admitting: Family Medicine

## 2021-02-20 ENCOUNTER — Other Ambulatory Visit: Payer: Self-pay

## 2021-02-20 ENCOUNTER — Telehealth: Payer: Self-pay

## 2021-02-20 VITALS — Temp 96.8°F | Ht 70.5 in | Wt 332.0 lb

## 2021-02-20 DIAGNOSIS — U071 COVID-19: Secondary | ICD-10-CM | POA: Diagnosis not present

## 2021-02-20 DIAGNOSIS — B349 Viral infection, unspecified: Secondary | ICD-10-CM

## 2021-02-20 NOTE — Progress Notes (Addendum)
Alex Batterman T. Kameryn Tisdel, MD Primary Care and Sports Medicine [facility] at [facility] [address] [place], [address] Phone: [phone]   FAX: [phone]  Alex Newman - 50 y.o. male   MRN [id number]   Date of Birth: [birth year]  Visit Date: 02/20/2021   PCP: Hannah Beat, MD   Referred by: Hannah Beat, MD  Virtual Visit via Video Note:  I connected with  Alex Newman on 02/20/2021  3:40 PM EST by a video enabled telemedicine application and verified that I am speaking with the correct person using two identifiers.   Location patient: home computer, tablet, or smartphone Location provider: work or home office Consent: Verbal consent directly obtained from [employer]. Persons participating in the virtual visit: patient, provider  I discussed the limitations of evaluation and management by telemedicine and the availability of in person appointments. The patient expressed understanding and agreed to proceed.  Chief Complaint  Patient presents with   Nasal Congestion    Symptoms started Saturday evening. No Covid Test    Cough    With green phlegm    History of Present Illness:  Very is a well-known long-term patient, and he presents with some symptoms that began on Saturday evening.  Primarily he is having some nasal congestion, rhinorrhea, as well as a productive cough with green sputum.  He is not having a fever, he denies polyarthralgia.  No GI symptoms including no diarrhea. He also is having a sore throat right now. His throat is scratchy, and he has had some loss of his voice.  He has been using a sinus flush and taking Tylenol and NyQuil.   Review of Systems as above: See pertinent positives and pertinent negatives per HPI No acute distress verbally   Observations/Objective/Exam:  An attempt was made to discern vital signs over the phone and per patient if applicable and possible.   General:    Alert, Oriented,  appears well and in no acute distress  Pulmonary:     On inspection no signs of respiratory distress.  Psych / Neurological:     Pleasant and cooperative.  Assessment and Plan:    ICD-10-CM   1. COVID-19  U07.1     2. Viral syndrome  B34.9      Very has multiple risk factors.  I strongly recommended that he get a COVID-19 test and his wife is going to pick him up some.  He will test this evening and tomorrow, and he is going to call and let me know the results.  If this is positive, then I would like to treat him with some antivirals.  If negative, continue with supportive care.  Given the extended upcoming weekend for Christmas, I am also going to give him some antibiotics to hold if his COVID is negative.  Calls back Covid +, will start Paxlovid.  I discussed the assessment and treatment plan with the patient. The patient was provided an opportunity to ask questions and all were answered. The patient agreed with the plan and demonstrated an understanding of the instructions.   The patient was advised to call back or seek an in-person evaluation if the symptoms worsen or if the condition fails to improve as anticipated.  Follow-up: prn unless noted otherwise below No follow-ups on file.  Meds ordered this encounter  Medications   nirmatrelvir/ritonavir EUA (PAXLOVID) 20 x 150 MG & 10 x 100MG  TABS    Sig: Take 3  tablets by mouth 2 (two) times daily for 5 days. (Take nirmatrelvir 150 mg two tablets twice daily for 5 days and ritonavir 100 mg one tablet twice daily for 5 days) Patient GFR is 100    Dispense:  30 tablet    Refill:  0   No orders of the defined types were placed in this encounter.   Signed,  Elpidio Galea. Pailynn Vahey, MD

## 2021-02-20 NOTE — Telephone Encounter (Signed)
[facility] Night - Client TELEPHONE ADVICE RECORD [employer] Patient Name: Alex Newman Gender: Male DOB: Aug 03, 1970 Age: 50 Y 9 M 26 D Return Phone Number: [phone] (Primary), [phone] (Secondary) Address: [place]  [address] Client [facility] Night - Client Client Site  [facility] [place] - Night Provider Hannah Beat - MD Contact Type Call Who Is Calling Patient / Member / Family / Caregiver Call Type Triage / Clinical Relationship To Patient Self Return Phone Number [phone] (Primary) Chief Complaint Nasal Congestion Reason for Call Medication Question / Request Initial Comment Caller states he think he has a sinus infection. Symptoms are sore throat,upper jaw and teeth pain,and sticky green when blow nose. Also congestion. Would like medication. Translation No Nurse Assessment Nurse: Mila Palmer, RN, Verlon Au Date/Time Lamount Cohen Time): 02/19/2021 11:47:38 AM Confirm and document reason for call. If symptomatic, describe symptoms. ---Caller states that he is having nasal congestion, mucous is green and sticky, pain in his upper jaw/ cheeks with pressure to teeth. Pain started a few hours ago. No fever. Does the patient have any new or worsening symptoms? ---Yes Will a triage be completed? ---Yes Related visit to physician within the last 2 weeks? ---No Does the PT have any chronic conditions? (i.e. diabetes, asthma, this includes High risk factors for pregnancy, etc.) ---Yes List chronic conditions. ---Diabetic, hypothyroid Is this a behavioral health or substance abuse call? ---No Guidelines Guideline Title Affirmed Question Affirmed Notes Nurse Date/Time Lamount Cohen Time) Sinus Pain or Congestion Lots of coughing Jerilee Field 02/19/2021 11:50:33 AM Disp. Time Lamount Cohen Time) Disposition Final User 02/19/2021 11:55:38 AM SEE PCP WITHIN 3 DAYS Yes Mila Palmer, RN,  Verlon Au PLEASE NOTE: All timestamps contained within this report are represented as [place] Standard Time. CONFIDENTIALTY NOTICE: This fax transmission is intended only for the addressee. It contains information that is legally privileged, confidential or otherwise protected from use or disclosure. If you are not the intended recipient, you are strictly prohibited from reviewing, disclosing, copying using or disseminating any of this information or taking any action in reliance on or regarding this information. If you have received this fax in error, please notify us immediately by telephone so that we can arrange for its return to [place]. Phone: [phone], Toll-Free: [phone], Fax: [phone] Page: 2 of 2 Call Id: [id number] Caller Disagree/Comply Comply Caller Understands Yes PreDisposition Call Doctor Care Advice Given Per Guideline SEE PCP WITHIN 3 DAYS: * You need to be seen within 2 or 3 days. * PCP VISIT: Call your doctor (or NP/PA) during regular office hours and make an appointment. A clinic or urgent care center are good places to go for care if your doctor's office is closed or you can't get an appointment. NOTE: If office will be open tomorrow, tell caller to call then, not in 3 days. NASAL WASHES FOR A STUFFY NOSE: * Introduction: Saline (salt water) nasal irrigation (nasal wash) is an effective and simple home remedy for treating stuffy nose and sinus congestion. The nose can be irrigated by pouring, spraying, or squirting salt water into the nose and then letting it run back out. * How it Helps: The salt water rinses out excess mucus and washes out any irritants (dust, allergens) that might be present. It also moistens the nasal cavity. * Methods: There are several ways to irrigate the nose. You can use a saline nasal spray bottle (available over-the-counter), a rubber ear syringe, a medical syringe without the needle, or  a NETI POT. CARE ADVICE given per Sinus Pain or Congestion  (Adult) guideline. * You become worse * Difficulty breathing (and not relieved by cleaning out nose) CALL BACK IF

## 2021-02-20 NOTE — Telephone Encounter (Signed)
I spoke with pt; pt said starting on 02/18/21 pt began with runny nose,head congestion, when blows nose has green mucus and prod cough with green phlegm; pt had chills on Sat night but no chills since then. Pt has also had scratchy throat. Pt said bright light bothers his eyes but he thinks related to sinus issues. Pt has been using a sinus flush, taking tylenol and nyquil. Pt does not have H/A, no loss of taste or smell, no diarrhea or vomiting. Pt does not have CP or wheezing. Pt does not have a home covid test and he may be able to get his wife to bring home covid test when she leaves work this afternoon. Pt scheduled video visit with Dr Patsy Lager 02/20/21 at 3:40.Self quarantine until home covid test, drink plenty of fluids, rest, and take Tylenol for fever. UC & ED precautions given and pt voiced understanding. Sending note to Dr Patsy Lager and Lupita Leash CMA.

## 2021-02-21 MED ORDER — NIRMATRELVIR/RITONAVIR (PAXLOVID)TABLET
3.0000 | ORAL_TABLET | Freq: Two times a day (BID) | ORAL | 0 refills | Status: AC
Start: 2021-02-21 — End: 2021-02-26

## 2021-02-21 NOTE — Addendum Note (Signed)
Addended by: Hannah Beat on: 02/21/2021 02:02 PM   Modules accepted: Orders

## 2021-02-21 NOTE — Telephone Encounter (Signed)
Mr. Sparks notified as instructed by telephone.

## 2021-02-21 NOTE — Telephone Encounter (Signed)
I sent him in Paxlovid.  He needs to fully isolate for 5 full days until Wed.  After this, he can be around others, but he still is mildly contagious, and he needs to wear a mask at all times until 5 additional days are over.  Next Tuesday, he does not need to wear a mask anymore.

## 2021-02-21 NOTE — Telephone Encounter (Signed)
Pt called back he did a home test for Covid and it was positive, he wanted to know what the next step was

## 2021-03-02 ENCOUNTER — Other Ambulatory Visit: Payer: Self-pay | Admitting: Family Medicine

## 2021-03-02 DIAGNOSIS — E119 Type 2 diabetes mellitus without complications: Secondary | ICD-10-CM

## 2021-03-02 MED ORDER — ONETOUCH VERIO VI STRP: ORAL_STRIP | 3 refills | Status: DC

## 2021-03-02 MED ORDER — ONETOUCH DELICA LANCETS 30G MISC: 3 refills | Status: DC

## 2021-03-07 MED ORDER — MICROLET LANCETS MISC: 3 refills | Status: DC

## 2021-03-07 MED ORDER — MICROLET NEXT LANCING DEVICE MISC: 0 refills | Status: DC

## 2021-03-07 MED ORDER — CONTOUR NEXT MONITOR W/DEVICE KIT: PACK | 0 refills | Status: DC

## 2021-06-07 ENCOUNTER — Telehealth: Payer: Self-pay | Admitting: Family Medicine

## 2021-06-07 NOTE — Telephone Encounter (Signed)
Left message for Mr. Lanz to return my call.  ?

## 2021-06-07 NOTE — Telephone Encounter (Signed)
Pt has questions about the protein in his urine, as he was denied life insurance because of this.  ?

## 2021-06-07 NOTE — Telephone Encounter (Signed)
Alex Newman notified as instructed by telephone.  He states he will have to check with his boss man to see when he can get off.  FYI to Dr. Lorelei Pont.  ?

## 2021-06-07 NOTE — Telephone Encounter (Signed)
Please call. ? ?Does have a very minor amount of protein in his urine, but the ratio protein to kidney function is actually normal.  By far the bigger issue is his blood sugar not being under control at all.  He is overdue to have a diabetes recheck, so he really should come in and have all of these things rechecked. ? ? ? ?

## 2021-09-06 NOTE — Telephone Encounter (Signed)
LVM to call the office to schedule appointment

## 2021-12-06 ENCOUNTER — Other Ambulatory Visit: Payer: Self-pay | Admitting: Family Medicine

## 2021-12-06 NOTE — Telephone Encounter (Signed)
Please call and schedule CPE with fasting labs prior for Dr. Copland.  

## 2021-12-11 ENCOUNTER — Other Ambulatory Visit: Payer: Self-pay | Admitting: Family Medicine

## 2021-12-20 ENCOUNTER — Other Ambulatory Visit: Payer: Self-pay | Admitting: Family Medicine

## 2021-12-20 DIAGNOSIS — Z1159 Encounter for screening for other viral diseases: Secondary | ICD-10-CM

## 2021-12-20 DIAGNOSIS — E7800 Pure hypercholesterolemia, unspecified: Secondary | ICD-10-CM

## 2021-12-20 DIAGNOSIS — Z114 Encounter for screening for human immunodeficiency virus [HIV]: Secondary | ICD-10-CM

## 2021-12-20 DIAGNOSIS — E119 Type 2 diabetes mellitus without complications: Secondary | ICD-10-CM

## 2021-12-20 DIAGNOSIS — Z79899 Other long term (current) drug therapy: Secondary | ICD-10-CM

## 2021-12-20 DIAGNOSIS — E039 Hypothyroidism, unspecified: Secondary | ICD-10-CM

## 2021-12-20 DIAGNOSIS — Z125 Encounter for screening for malignant neoplasm of prostate: Secondary | ICD-10-CM

## 2022-01-01 ENCOUNTER — Encounter: Payer: BC Managed Care – PPO | Admitting: Family Medicine

## 2022-01-02 ENCOUNTER — Other Ambulatory Visit (INDEPENDENT_AMBULATORY_CARE_PROVIDER_SITE_OTHER): Payer: BC Managed Care – PPO

## 2022-01-02 DIAGNOSIS — Z125 Encounter for screening for malignant neoplasm of prostate: Secondary | ICD-10-CM

## 2022-01-02 DIAGNOSIS — Z114 Encounter for screening for human immunodeficiency virus [HIV]: Secondary | ICD-10-CM | POA: Diagnosis not present

## 2022-01-02 DIAGNOSIS — Z1159 Encounter for screening for other viral diseases: Secondary | ICD-10-CM | POA: Diagnosis not present

## 2022-01-02 DIAGNOSIS — E039 Hypothyroidism, unspecified: Secondary | ICD-10-CM | POA: Diagnosis not present

## 2022-01-02 DIAGNOSIS — E7800 Pure hypercholesterolemia, unspecified: Secondary | ICD-10-CM | POA: Diagnosis not present

## 2022-01-02 DIAGNOSIS — Z79899 Other long term (current) drug therapy: Secondary | ICD-10-CM

## 2022-01-02 DIAGNOSIS — E119 Type 2 diabetes mellitus without complications: Secondary | ICD-10-CM

## 2022-01-02 LAB — MICROALBUMIN / CREATININE URINE RATIO
Creatinine,U: 128 mg/dL
Microalb Creat Ratio: 5.4 mg/g (ref 0.0–30.0)
Microalb, Ur: 7 mg/dL — ABNORMAL HIGH (ref 0.0–1.9)

## 2022-01-02 LAB — HEMOGLOBIN A1C: Hgb A1c MFr Bld: 8.5 % — ABNORMAL HIGH (ref 4.6–6.5)

## 2022-01-02 LAB — CBC WITH DIFFERENTIAL/PLATELET
Basophils Absolute: 0 10*3/uL (ref 0.0–0.1)
Basophils Relative: 0.3 % (ref 0.0–3.0)
Eosinophils Absolute: 0 10*3/uL (ref 0.0–0.7)
Eosinophils Relative: 0.6 % (ref 0.0–5.0)
HCT: 47.6 % (ref 39.0–52.0)
Hemoglobin: 16.1 g/dL (ref 13.0–17.0)
Lymphocytes Relative: 33.9 % (ref 12.0–46.0)
Lymphs Abs: 1.9 10*3/uL (ref 0.7–4.0)
MCHC: 33.9 g/dL (ref 30.0–36.0)
MCV: 87.9 fl (ref 78.0–100.0)
Monocytes Absolute: 0.3 10*3/uL (ref 0.1–1.0)
Monocytes Relative: 5.8 % (ref 3.0–12.0)
Neutro Abs: 3.4 10*3/uL (ref 1.4–7.7)
Neutrophils Relative %: 59.4 % (ref 43.0–77.0)
Platelets: 166 10*3/uL (ref 150.0–400.0)
RBC: 5.41 Mil/uL (ref 4.22–5.81)
RDW: 13.9 % (ref 11.5–15.5)
WBC: 5.7 10*3/uL (ref 4.0–10.5)

## 2022-01-02 LAB — HEPATIC FUNCTION PANEL
ALT: 32 U/L (ref 0–53)
AST: 27 U/L (ref 0–37)
Albumin: 4.3 g/dL (ref 3.5–5.2)
Alkaline Phosphatase: 108 U/L (ref 39–117)
Bilirubin, Direct: 0.2 mg/dL (ref 0.0–0.3)
Total Bilirubin: 1 mg/dL (ref 0.2–1.2)
Total Protein: 7 g/dL (ref 6.0–8.3)

## 2022-01-02 LAB — BASIC METABOLIC PANEL
BUN: 11 mg/dL (ref 6–23)
CO2: 26 mEq/L (ref 19–32)
Calcium: 9.4 mg/dL (ref 8.4–10.5)
Chloride: 103 mEq/L (ref 96–112)
Creatinine, Ser: 0.81 mg/dL (ref 0.40–1.50)
GFR: 101.24 mL/min (ref >60.00)
Glucose, Bld: 180 mg/dL — ABNORMAL HIGH (ref 70–99)
Potassium: 4.3 mEq/L (ref 3.5–5.1)
Sodium: 139 mEq/L (ref 135–145)

## 2022-01-02 LAB — T3, FREE: T3, Free: 3.7 pg/mL (ref 2.3–4.2)

## 2022-01-02 LAB — LIPID PANEL
Cholesterol: 200 mg/dL (ref 0–200)
HDL: 30.6 mg/dL — ABNORMAL LOW (ref >39.00)
NonHDL: 169.34
Total CHOL/HDL Ratio: 7
Triglycerides: 212 mg/dL — ABNORMAL HIGH (ref 0.0–149.0)
VLDL: 42.4 mg/dL — ABNORMAL HIGH (ref 0.0–40.0)

## 2022-01-02 LAB — T4, FREE: Free T4: 0.91 ng/dL (ref 0.60–1.60)

## 2022-01-02 LAB — TSH: TSH: 1.11 u[IU]/mL (ref 0.35–5.50)

## 2022-01-02 LAB — LDL CHOLESTEROL, DIRECT: Direct LDL: 139 mg/dL

## 2022-01-03 LAB — HIV ANTIBODY (ROUTINE TESTING W REFLEX): HIV 1&2 Ab, 4th Generation: NONREACTIVE

## 2022-01-03 LAB — PSA, TOTAL WITH REFLEX TO PSA, FREE: PSA, Total: 1 ng/mL (ref <=4.0)

## 2022-01-03 LAB — HEPATITIS C ANTIBODY: Hepatitis C Ab: NONREACTIVE

## 2022-01-05 ENCOUNTER — Telehealth: Payer: Self-pay | Admitting: Family Medicine

## 2022-01-05 DIAGNOSIS — E119 Type 2 diabetes mellitus without complications: Secondary | ICD-10-CM

## 2022-01-05 MED ORDER — ONETOUCH VERIO FLEX SYSTEM W/DEVICE KIT: PACK | 0 refills | Status: DC

## 2022-01-05 NOTE — Telephone Encounter (Signed)
Patient called back in to follow up on this. He stated that it is important.

## 2022-01-05 NOTE — Telephone Encounter (Signed)
Spoke with [name].  He is very upset about his glucose number.  He states he thought he was been doing good with his blood sugar but now he thinks his glucose meter has been reading 60 mg/dl lower.  He states the morning he came in to do his labs, he ate breakfast at 5:00 am.  When he arrived at the office for his lab work, he checked his blood sugar on his machine while in his truck at got a reading of 122 mg/dl.  But when his labs came back his glucose reading was 180 mg/dl.  I recommended that he bring his glucose meter with him to his appointment and we can check it against our machine.  He said his glucose meter no longer exist because he was so upset his smashed it.  I told him I will send him in a new meter. He states on his machine his readings were running between 80-90 mg/dl to 150-160 mg.  I let him know I will make Dr. Lorelei Pont aware and they can discuss this further at his appointment next week.

## 2022-01-05 NOTE — Telephone Encounter (Signed)
Patient called to get a better understanding of his blood work. Call back number [phone].

## 2022-01-10 ENCOUNTER — Ambulatory Visit (INDEPENDENT_AMBULATORY_CARE_PROVIDER_SITE_OTHER): Payer: BC Managed Care – PPO | Admitting: Family Medicine

## 2022-01-10 ENCOUNTER — Encounter: Payer: Self-pay | Admitting: Family Medicine

## 2022-01-10 ENCOUNTER — Other Ambulatory Visit: Payer: Self-pay | Admitting: Family Medicine

## 2022-01-10 VITALS — BP 130/80 | HR 76 | Temp 98.6°F | Ht 70.25 in | Wt 321.5 lb

## 2022-01-10 DIAGNOSIS — Z23 Encounter for immunization: Secondary | ICD-10-CM

## 2022-01-10 DIAGNOSIS — Z1211 Encounter for screening for malignant neoplasm of colon: Secondary | ICD-10-CM | POA: Diagnosis not present

## 2022-01-10 DIAGNOSIS — Z0000 Encounter for general adult medical examination without abnormal findings: Secondary | ICD-10-CM

## 2022-01-10 DIAGNOSIS — E119 Type 2 diabetes mellitus without complications: Secondary | ICD-10-CM | POA: Diagnosis not present

## 2022-01-10 LAB — GLUCOSE, POCT (MANUAL RESULT ENTRY): POC Glucose: 101 mg/dl — AB (ref 70–99)

## 2022-01-10 MED ORDER — ATORVASTATIN CALCIUM 20 MG PO TABS: 20.0000 mg | ORAL_TABLET | Freq: Every day | ORAL | 3 refills | Status: DC

## 2022-01-10 NOTE — Progress Notes (Signed)
Alex Newman T. Alex Whitsitt, MD, [place] [facility] [place], [address]  Phone: [phone]  FAX: [place] - 51 y.o. male  MRN [id number]  Date of Birth: 1970-08-20  Date: 01/10/2022  PCP: Owens Loffler, MD  Referral: Owens Loffler, MD  Chief Complaint  Patient presents with   Annual Exam   Patient Care Team: Owens Loffler, MD as PCP - General Subjective:   Alex Newman is a 51 y.o. pleasant patient who presents with the following:  Preventative Health Maintenance Visit:  Health Maintenance Summary Reviewed and updated, unless pt declines services.  Tobacco History Reviewed. Alcohol: No concerns, no excessive use Exercise Habits: Some activity, rec at least 30 mins 5 times a week STD concerns: no risk or activity to increase risk Drug Use: None  He is very upset.  He feels like his blood sugar meter has not been working correctly.  He actually got so upset that he broke it when he is labs came back.  He has since gotten 2 other meters, and he thinks that the one that he has right now is working.  He has not been following a diabetic diet very well, he has been eating a lot of pasta, drinking sweet tea and having some sugary sodas.  Multiple vaccines: Question if he would like to get these again Influenza COVID Tetanus - did that today Shingles Prevnar 20 Declines all others  Foot exam Eye exam - next Thursday Colonoscopy - declines, but he will do cologuard  Diabetes Mellitus: Tolerating Medications: yes He is currently on metformin XR 1 tablet p.o. daily, 500 mg Compliance with diet: fair, Body mass index is 45.8 kg/m. Exercise: minimal / intermittent Avg blood sugars at home: Checking, concerns that his blood sugar meter has not been reading correctly Foot problems: none Hypoglycemia: none No nausea, vomitting, blurred vision, polyuria.  Lab Results  Component  Value Date   HGBA1C 8.5 (H) 01/02/2022   HGBA1C 8.1 (H) 10/31/2020   HGBA1C 7.7 (A) 08/26/2019   Lab Results  Component Value Date   MICROALBUR 7.0 (H) 01/02/2022   LDLCALC 141 (H) 10/31/2020   CREATININE 0.81 01/02/2022   Morbid obesity continues.  Wt Readings from Last 3 Encounters:  01/10/22 (!) 321 lb 8 oz (145.8 kg)  02/20/21 (!) 332 lb (150.6 kg)  10/31/20 (!) 336 lb 4 oz (152.5 kg)  Body mass index is 45.8 kg/m.   Health Maintenance  Topic Date Due   OPHTHALMOLOGY EXAM  01/10/2022 (Originally 04/27/1979)   COVID-19 Vaccine (1) 01/26/2022 (Originally 04/26/1974)   Zoster Vaccines- Shingrix (1 of 2) 04/12/2022 (Originally 04/26/1988)   INFLUENZA VACCINE  06/03/2022 (Originally 10/03/2021)   COLONOSCOPY (Pts 45-68yr Insurance coverage will need to be confirmed)  01/11/2023 (Originally 04/26/2014)   HEMOGLOBIN A1C  07/03/2022   Diabetic kidney evaluation - GFR measurement  01/03/2023   Diabetic kidney evaluation - Urine ACR  01/03/2023   FOOT EXAM  01/11/2023   TETANUS/TDAP  01/11/2032   Hepatitis C Screening  Completed   HIV Screening  Completed   HPV VACCINES  Aged Out   Immunization History  Administered Date(s) Administered   Tdap 01/10/2022   Patient Active Problem List   Diagnosis Date Noted   Diabetes mellitus without complication ([place] 041/93/7902   Priority: High   MORBID OBESITY 05/18/2010    Priority: Medium    HYPERCHOLESTEROLEMIA 07/10/2007    Priority: Medium  Hypothyroidism, acquired 07/10/2007    Priority: Medium    GERD (gastroesophageal reflux disease) 02/11/2013   BACK PAIN, LUMBAR 09/29/2007   ALLERGIC RHINITIS 07/10/2007   RENAL CALCULUS 07/10/2007    Past Medical History:  Diagnosis Date   Allergic rhinitis due to pollen    GERD (gastroesophageal reflux disease) 02/11/2013   Hyperlipidemia    Hypothyroidism    Kidney stone    Morbid obesity with BMI of 45.0-49.9, adult ([place])    Nephrolithiasis     Past Surgical History:   Procedure Laterality Date   GANGLION CYST EXCISION Right    wrist x 2   kidney stone removal     x2    Family History  Problem Relation Age of Onset   Thyroid disease Mother    Bipolar disorder Mother    Heart disease Father    Neuropathy Father    Stroke Maternal Grandfather    Lung cancer Maternal Grandmother     Social History   Social History Narrative   Not on file    Past Medical History, Surgical History, Social History, Family History, Problem List, Medications, and Allergies have been reviewed and updated if relevant.  Review of Systems: Pertinent positives are listed above.  Otherwise, a full 14 point review of systems has been done in full and it is negative except where it is noted positive.  Objective:   BP 130/80   Pulse 76   Temp 98.6 F (37 C) (Oral)   Ht 5' 10.25" (1.784 m)   Wt (!) 321 lb 8 oz (145.8 kg)   SpO2 98%   BMI 45.80 kg/m  Ideal Body Weight: Weight in (lb) to have BMI = 25: 175.1  Ideal Body Weight: Weight in (lb) to have BMI = 25: 175.1 No results found.    01/10/2022    3:55 PM 10/31/2020   10:48 AM 09/17/2018    2:55 PM 08/14/2017    8:52 AM 08/29/2015    4:04 PM  Depression screen PHQ 2/9  Decreased Interest 0 0 0 1 0  Down, Depressed, Hopeless 0 0 0 0 1  PHQ - 2 Score 0 0 0 1 1     GEN: well developed, well nourished, no acute distress Eyes: conjunctiva and lids normal, PERRLA, EOMI ENT: TM clear, nares clear, oral exam WNL Neck: supple, no lymphadenopathy, no thyromegaly, no JVD Pulm: clear to auscultation and percussion, respiratory effort normal CV: regular rate and rhythm, S1-S2, no murmur, rub or gallop, no bruits, peripheral pulses normal and symmetric, no cyanosis, clubbing, edema or varicosities GI: soft, non-tender; no hepatosplenomegaly, masses; active bowel sounds all quadrants GU: deferred Lymph: no cervical, axillary or inguinal adenopathy MSK: gait normal, muscle tone and strength WNL, no joint swelling,  effusions, discoloration, crepitus  SKIN: clear, good turgor, color WNL, no rashes, lesions, or ulcerations Neuro: normal mental status, normal strength, sensation, and motion Psych: alert; oriented to person, place and time, normally interactive and not anxious or depressed in appearance.  All labs reviewed with patient. Results for orders placed or performed in visit on 01/10/22  POCT glucose (manual entry)  Result Value Ref Range   POC Glucose 101 (A) 70 - 99 mg/dl    Assessment and Plan:     ICD-10-CM   1. Healthcare maintenance  Z00.00     2. Colon cancer screening  Z12.11 Cologuard    3. Diabetes mellitus without complication (HCC)  D98.3 POCT glucose (manual entry)    4. Need for  Tdap vaccination  Z23 Tdap vaccine greater than or equal to 7yo IM     He will get a tetanus shot today, but he declines all other vaccination.  He will agree to a Cologuard, but he declines colonoscopy.  Blood sugar remains elevated, slightly elevated microalbumin, and he declines additional medicine at this time.  He would like to do some additional diet management, weight loss, and we will recheck this in 3 months.  We will start atorvastatin.  He felt like he had some memory changes when he took Crestor.  The patient declines routine health maintenance services noted. We reviewed that could lead to missing significant problems that could affect there mortality. The patient indicated that they understood this and was willing to accept those risks.   Health Maintenance Exam: The patient's preventative maintenance and recommended screening tests for an annual wellness exam were reviewed in full today. Brought up to date unless services declined.  Counselled on the importance of diet, exercise, and its role in overall health and mortality. The patient's FH and SH was reviewed, including their home life, tobacco status, and drug and alcohol status.  Follow-up in 1 year for physical exam or  additional follow-up below.  Disposition: Return in about 3 months (around 04/12/2022) for diabetes follow-up.  Meds ordered this encounter  Medications   atorvastatin (LIPITOR) 20 MG tablet    Sig: Take 1 tablet (20 mg total) by mouth daily.    Dispense:  90 tablet    Refill:  3   Medications Discontinued During This Encounter  Medication Reason   naproxen (NAPROSYN) 375 MG tablet Completed Course   Orders Placed This Encounter  Procedures   Tdap vaccine greater than or equal to 7yo IM   Cologuard   POCT glucose (manual entry)    Signed,  Mathias Bogacki T. Ardath Lepak, MD   Allergies as of 01/10/2022       Reactions   Cetirizine Hcl    REACTION: Headache   Penicillins    Propoxyphene N-acetaminophen    REACTION: ha, nausea        Medication List        Accurate as of January 10, 2022  4:34 PM. If you have any questions, ask your nurse or doctor.          STOP taking these medications    naproxen 375 MG tablet Commonly known as: NAPROSYN Stopped by: Owens Loffler, MD       TAKE these medications    atorvastatin 20 MG tablet Commonly known as: LIPITOR Take 1 tablet (20 mg total) by mouth daily. Started by: Owens Loffler, MD   cyclobenzaprine 10 MG tablet Commonly known as: FLEXERIL Take 1 tablet (10 mg total) by mouth 2 (two) times daily as needed for muscle spasms.   ibuprofen 600 MG tablet Commonly known as: ADVIL Take 1 tablet (600 mg total) by mouth every 6 (six) hours as needed.   levothyroxine 75 MCG tablet Commonly known as: SYNTHROID TAKE 1 TABLET BY MOUTH EVERY DAY   metFORMIN 500 MG 24 hr tablet Commonly known as: GLUCOPHAGE-XR TAKE 1 TABLET BY MOUTH EVERY DAY WITH BREAKFAST   Microlet Lancets Misc Use to check blood sugar two times a day   ondansetron 8 MG disintegrating tablet Commonly known as: ZOFRAN-ODT DISSOLVE ONE TABLET IN THE MOUTH EVERY 8HOURS AS NEEDED FOR NAUSEA AND VOMITING.   Contour Blood Glucose System w/Device  Kit by Does not apply route.   OneTouch Verio Flex System w/Device  Kit Use to check blood sugar 2 times a day

## 2022-01-10 NOTE — Addendum Note (Signed)
Addended by: Hannah Beat on: 01/10/2022 04:44 PM   Modules accepted: Orders

## 2022-01-23 DIAGNOSIS — E113393 Type 2 diabetes mellitus with moderate nonproliferative diabetic retinopathy without macular edema, bilateral: Secondary | ICD-10-CM | POA: Diagnosis not present

## 2022-03-15 ENCOUNTER — Other Ambulatory Visit: Payer: Self-pay | Admitting: Family Medicine

## 2022-04-12 ENCOUNTER — Ambulatory Visit: Payer: BC Managed Care – PPO | Admitting: Family Medicine

## 2022-04-20 ENCOUNTER — Other Ambulatory Visit: Payer: Self-pay | Admitting: Family Medicine

## 2022-04-20 DIAGNOSIS — E119 Type 2 diabetes mellitus without complications: Secondary | ICD-10-CM

## 2022-05-12 ENCOUNTER — Other Ambulatory Visit: Payer: Self-pay | Admitting: Family Medicine

## 2022-05-12 NOTE — Telephone Encounter (Signed)
Please call and schedule diabetes follow up with Dr. Lorelei Pont.

## 2022-05-14 NOTE — Telephone Encounter (Signed)
Lvmtcb, sent mychart message  

## 2022-09-15 ENCOUNTER — Other Ambulatory Visit: Payer: Self-pay | Admitting: Family Medicine

## 2022-09-17 NOTE — Telephone Encounter (Signed)
Last office visit 01/10/22 for CPE.  AVS stated to follow up in 3 months.  Support pool has left voicemails and sent MyChart message asking patient to call and schedule appointment.   Patient has not responded.  No future appointments. Refill?

## 2022-10-24 DIAGNOSIS — E1369 Other specified diabetes mellitus with other specified complication: Secondary | ICD-10-CM | POA: Diagnosis not present

## 2022-10-24 DIAGNOSIS — R5383 Other fatigue: Secondary | ICD-10-CM | POA: Diagnosis not present

## 2022-10-24 DIAGNOSIS — E039 Hypothyroidism, unspecified: Secondary | ICD-10-CM | POA: Diagnosis not present

## 2022-11-12 DIAGNOSIS — E039 Hypothyroidism, unspecified: Secondary | ICD-10-CM | POA: Diagnosis not present

## 2022-11-12 DIAGNOSIS — E1369 Other specified diabetes mellitus with other specified complication: Secondary | ICD-10-CM | POA: Diagnosis not present

## 2022-11-12 DIAGNOSIS — R5383 Other fatigue: Secondary | ICD-10-CM | POA: Diagnosis not present

## 2023-02-17 ENCOUNTER — Other Ambulatory Visit: Payer: Self-pay | Admitting: Family Medicine

## 2023-02-18 ENCOUNTER — Ambulatory Visit
Admission: EM | Admit: 2023-02-18 | Discharge: 2023-02-18 | Disposition: A | Discharge status: Home / Self Care | Payer: BC Managed Care – PPO | Attending: Emergency Medicine | Admitting: Emergency Medicine

## 2023-02-18 DIAGNOSIS — N3000 Acute cystitis without hematuria: Secondary | ICD-10-CM | POA: Diagnosis not present

## 2023-02-18 DIAGNOSIS — B9689 Other specified bacterial agents as the cause of diseases classified elsewhere: Secondary | ICD-10-CM | POA: Diagnosis not present

## 2023-02-18 DIAGNOSIS — R109 Unspecified abdominal pain: Secondary | ICD-10-CM | POA: Insufficient documentation

## 2023-02-18 LAB — POCT URINALYSIS DIP (MANUAL ENTRY)
Bilirubin, UA: NEGATIVE
Glucose, UA: 100 mg/dL — AB
Ketones, POC UA: NEGATIVE mg/dL
Nitrite, UA: POSITIVE — AB
Protein Ur, POC: 100 mg/dL — AB
Spec Grav, UA: 1.02
Urobilinogen, UA: 0.2 U/dL
pH, UA: 5.5

## 2023-02-18 MED ORDER — KETOROLAC TROMETHAMINE 30 MG/ML IJ SOLN
30.0000 mg | Freq: Once | INTRAMUSCULAR | Status: AC
  Administered 2023-02-18: 30 mg via INTRAMUSCULAR

## 2023-02-18 MED ORDER — NITROFURANTOIN MONOHYD MACRO 100 MG PO CAPS: 100.0000 mg | ORAL_CAPSULE | Freq: Two times a day (BID) | ORAL | 0 refills | Status: AC

## 2023-02-18 NOTE — ED Provider Notes (Signed)
Alex Newman    CSN: [id number] Arrival date & time: 02/18/23  1116      History   Chief Complaint Chief Complaint  Patient presents with   Flank Pain    HPI Alex Newman is a 52 y.o. male.   Patient presents for evaluation of right-sided flank pain beginning 2 hours ago.  Intermittently, described as sharp, rating 8 out of 10.  Has history of reoccurring kidney stones requiring lithotripsy.  Endorses that while pain feels similar it is lower than typical presentation.  Had 1 occurrence of dysuria when giving urine sample today in clinic.  Denies urinary frequency, urgency, hematuria, penile discharge or penile or testicle swelling.   Past Medical History:  Diagnosis Date   Allergic rhinitis due to pollen    GERD (gastroesophageal reflux disease) 02/11/2013   Hyperlipidemia    Hypothyroidism    Kidney stone    Morbid obesity with BMI of 45.0-49.9, adult [facility])    Nephrolithiasis     Patient Active Problem List   Diagnosis Date Noted   Diabetes mellitus without complication (HCC) 10/31/2020   GERD (gastroesophageal reflux disease) 02/11/2013   MORBID OBESITY 05/18/2010   BACK PAIN, LUMBAR 09/29/2007   HYPERCHOLESTEROLEMIA 07/10/2007   ALLERGIC RHINITIS 07/10/2007   RENAL CALCULUS 07/10/2007   Hypothyroidism, acquired 07/10/2007    Past Surgical History:  Procedure Laterality Date   GANGLION CYST EXCISION Right    wrist x 2   kidney stone removal     x2       Home Medications    Prior to Admission medications   Medication Sig Start Date End Date Taking? Authorizing Provider  nitrofurantoin, macrocrystal-monohydrate, (MACROBID) 100 MG capsule Take 1 capsule (100 mg total) by mouth 2 (two) times daily for 7 days. 02/18/23 02/25/23 Yes Sheera Illingworth R, NP  atorvastatin (LIPITOR) 20 MG tablet Take 1 tablet (20 mg total) by mouth daily. 01/10/22   Copland, Karleen Hampshire, MD  Blood Glucose Monitoring Suppl (CONTOUR BLOOD GLUCOSE SYSTEM) w/Device KIT by  Does not apply route.    [provider]  CONTOUR NEXT TEST test strip USE TO CHECK BLOOD SUGAR TWO TIMES A DAY 04/20/22   Copland, Karleen Hampshire, MD  cyclobenzaprine (FLEXERIL) 10 MG tablet Take 1 tablet (10 mg total) by mouth 2 (two) times daily as needed for muscle spasms. 06/13/20   Mickie Bail, NP  ibuprofen (ADVIL) 600 MG tablet Take 1 tablet (600 mg total) by mouth every 6 (six) hours as needed. 06/13/20   Mickie Bail, NP  levothyroxine (SYNTHROID) 75 MCG tablet TAKE 1 TABLET BY MOUTH EVERY DAY 01/10/22   Copland, Karleen Hampshire, MD  metFORMIN (GLUCOPHAGE-XR) 500 MG 24 hr tablet TAKE 1 TABLET BY MOUTH EVERY DAY WITH BREAKFAST 09/17/22   Copland, Karleen Hampshire, MD  Microlet Lancets MISC Use to check blood sugar two times a day 03/07/21   Copland, Karleen Hampshire, MD  ondansetron (ZOFRAN-ODT) 8 MG disintegrating tablet DISSOLVE ONE TABLET IN THE MOUTH EVERY 8HOURS AS NEEDED FOR NAUSEA AND VOMITING. 10/31/20   Copland, Karleen Hampshire, MD  OZEMPIC, 0.25 OR 0.5 MG/DOSE, 2 MG/3ML SOPN SMARTSIG:0.25 Milligram(s) SUB-Q Once a Week    [provider]    Family History Family History  Problem Relation Age of Onset   Thyroid disease Mother    Bipolar disorder Mother    Heart disease Father    Neuropathy Father    Stroke Maternal Grandfather    Lung cancer Maternal Grandmother     Social History Social  History   Tobacco Use   Smoking status: Never   Smokeless tobacco: Never  Vaping Use   Vaping status: Never Used  Substance Use Topics   Alcohol use: No    Alcohol/week: 0.0 standard drinks of alcohol   Drug use: No     Allergies   Cetirizine hcl, Penicillins, and Propoxyphene n-acetaminophen   Review of Systems Review of Systems   Physical Exam Triage Vital Signs ED Triage Vitals  Encounter Vitals Group     BP 02/18/23 1137 (!) 142/88     Systolic BP Percentile --      Diastolic BP Percentile --      Pulse Rate 02/18/23 1137 87     Resp 02/18/23 1137 18     Temp 02/18/23 1137 98.5 F  (36.9 C)     Temp src --      SpO2 02/18/23 1137 98 %     Weight --      Height --      Head Circumference --      Peak Flow --      Pain Score 02/18/23 1126 2     Pain Loc --      Pain Education --      Exclude from Growth Chart --    No data found.  Updated Vital Signs BP (!) 142/88   Pulse 87   Temp 98.5 F (36.9 C)   Resp 18   SpO2 98%   Visual Acuity Right Eye Distance:   Left Eye Distance:   Bilateral Distance:    Right Eye Near:   Left Eye Near:    Bilateral Near:     Physical Exam Constitutional:      Appearance: Normal appearance.  Eyes:     Extraocular Movements: Extraocular movements intact.  Pulmonary:     Effort: Pulmonary effort is normal.  Abdominal:     General: There is no distension.     Tenderness: There is no abdominal tenderness. There is no right CVA tenderness or left CVA tenderness.  Neurological:     Mental Status: He is alert and oriented to person, place, and time. Mental status is at baseline.      UC Treatments / Results  Labs (all labs ordered are listed, but only abnormal results are displayed) Labs Reviewed  POCT URINALYSIS DIP (MANUAL ENTRY) - Abnormal; Notable for the following components:      Result Value   Clarity, UA cloudy (*)    Glucose, UA =100 (*)    Blood, UA moderate (*)    Protein Ur, POC =100 (*)    Nitrite, UA Positive (*)    Leukocytes, UA Large (3+) (*)    All other components within normal limits  URINE CULTURE    EKG   Radiology No results found.  Procedures Procedures (including critical care time)  Medications Ordered in UC Medications  ketorolac (TORADOL) 30 MG/ML injection 30 mg (has no administration in time range)    Initial Impression / Assessment and Plan / UC Course  I have reviewed the triage vital signs and the nursing notes.  Pertinent labs & imaging results that were available during my care of the patient were reviewed by me and considered in my medical decision making  (see chart for details).  Acute cystitis without hematuria  Vitals are stable, no signs of sepsis, urinalysis showing leukocytes and nitrates, sent for culture, discussed findings with patient, Macrobid prescribed and Toradol IM given prior to discharge to  help manage discomfort, recommended additional supportive care, at this time would like to hold off on prophylactic treatment for kidney stones, will monitor at home and follow-up with urgent care or primary doctor as needed Final Clinical Impressions(s) / UC Diagnoses   Final diagnoses:  Acute cystitis without hematuria     Discharge Instructions      Your urinalysis shows Alex Newman blood cells and nitrates which are indicative of infection, your urine will be sent to the lab to determine exactly which bacteria is present, if any changes need to be made to your medications you will be notified  You have been given an injection of Toradol to help reduce inflammation and help with pain  Begin use of Macrobid every morning and every evening for 7 days   You may use over-the-counter Azo to help minimize your symptoms until antibiotic removes bacteria, this medication will turn your urine orange  Increase your fluid intake through use of water  As always practice good hygiene, wiping front to back and avoidance of scented vaginal products to prevent further irritation  If symptoms continue to persist after use of medication or recur please follow-up with urgent care or your primary doctor as needed    ED Prescriptions     Medication Sig Dispense Auth. Provider   nitrofurantoin, macrocrystal-monohydrate, (MACROBID) 100 MG capsule Take 1 capsule (100 mg total) by mouth 2 (two) times daily for 7 days. 14 capsule Solon Alban, Elita Boone, NP      PDMP not reviewed this encounter.   Valinda Hoar, NP 02/18/23 1154

## 2023-02-18 NOTE — Discharge Instructions (Addendum)
Your urinalysis shows Alex Newman blood cells and nitrates which are indicative of infection, your urine will be sent to the lab to determine exactly which bacteria is present, if any changes need to be made to your medications you will be notified  You have been given an injection of Toradol to help reduce inflammation and help with pain  Begin use of Macrobid every morning and every evening for 7 days   You may use over-the-counter Azo to help minimize your symptoms until antibiotic removes bacteria, this medication will turn your urine orange  Increase your fluid intake through use of water  As always practice good hygiene, wiping front to back and avoidance of scented vaginal products to prevent further irritation  If symptoms continue to persist after use of medication or recur please follow-up with urgent care or your primary doctor as needed

## 2023-02-18 NOTE — ED Triage Notes (Signed)
Patient to Urgent Care with complaints of right sided flank-lower back pain. Pain intermittently worsens with sharp pains, constant dull ache around 2/10.   Symptoms started two hours ago. Hx of kidney stones- reports over 20 in his lifetime. Reports pain is significantly lower than other kidney stones.

## 2023-02-22 LAB — URINE CULTURE: Culture: >=100000 — AB

## 2023-04-29 ENCOUNTER — Other Ambulatory Visit: Payer: Self-pay | Admitting: Family Medicine

## 2023-04-29 DIAGNOSIS — E119 Type 2 diabetes mellitus without complications: Secondary | ICD-10-CM

## 2023-06-14 DIAGNOSIS — E1369 Other specified diabetes mellitus with other specified complication: Secondary | ICD-10-CM | POA: Diagnosis not present

## 2023-06-14 DIAGNOSIS — E039 Hypothyroidism, unspecified: Secondary | ICD-10-CM | POA: Diagnosis not present

## 2023-06-20 DIAGNOSIS — R399 Unspecified symptoms and signs involving the genitourinary system: Secondary | ICD-10-CM | POA: Diagnosis not present

## 2023-07-15 DIAGNOSIS — E1369 Other specified diabetes mellitus with other specified complication: Secondary | ICD-10-CM | POA: Diagnosis not present

## 2023-07-15 DIAGNOSIS — R399 Unspecified symptoms and signs involving the genitourinary system: Secondary | ICD-10-CM | POA: Diagnosis not present

## 2023-07-15 DIAGNOSIS — E039 Hypothyroidism, unspecified: Secondary | ICD-10-CM | POA: Diagnosis not present

## 2023-07-15 DIAGNOSIS — N529 Male erectile dysfunction, unspecified: Secondary | ICD-10-CM | POA: Diagnosis not present

## 2023-10-22 ENCOUNTER — Emergency Department (HOSPITAL_BASED_OUTPATIENT_CLINIC_OR_DEPARTMENT_OTHER)

## 2023-10-22 ENCOUNTER — Other Ambulatory Visit (HOSPITAL_BASED_OUTPATIENT_CLINIC_OR_DEPARTMENT_OTHER): Payer: Self-pay

## 2023-10-22 ENCOUNTER — Emergency Department (HOSPITAL_BASED_OUTPATIENT_CLINIC_OR_DEPARTMENT_OTHER)
Admission: EM | Admit: 2023-10-22 | Discharge: 2023-10-22 | Disposition: A | Discharge status: Home / Self Care | Attending: Emergency Medicine | Admitting: Emergency Medicine

## 2023-10-22 ENCOUNTER — Other Ambulatory Visit: Payer: Self-pay

## 2023-10-22 DIAGNOSIS — K760 Fatty (change of) liver, not elsewhere classified: Secondary | ICD-10-CM | POA: Diagnosis not present

## 2023-10-22 DIAGNOSIS — D72829 Elevated white blood cell count, unspecified: Secondary | ICD-10-CM | POA: Insufficient documentation

## 2023-10-22 DIAGNOSIS — R109 Unspecified abdominal pain: Secondary | ICD-10-CM | POA: Diagnosis not present

## 2023-10-22 DIAGNOSIS — N132 Hydronephrosis with renal and ureteral calculous obstruction: Secondary | ICD-10-CM | POA: Insufficient documentation

## 2023-10-22 DIAGNOSIS — N201 Calculus of ureter: Secondary | ICD-10-CM

## 2023-10-22 LAB — BASIC METABOLIC PANEL WITH GFR
Anion gap: 13 (ref 5–15)
BUN: 10 mg/dL (ref 6–20)
CO2: 20 mmol/L — ABNORMAL LOW (ref 22–32)
Calcium: 9.7 mg/dL (ref 8.9–10.3)
Chloride: 103 mmol/L (ref 98–111)
Creatinine, Ser: 1.22 mg/dL (ref 0.61–1.24)
GFR, Estimated: >60 mL/min (ref >60)
Glucose, Bld: 157 mg/dL — ABNORMAL HIGH (ref 70–99)
Potassium: 4.5 mmol/L (ref 3.5–5.1)
Sodium: 136 mmol/L (ref 135–145)

## 2023-10-22 LAB — CBC
HCT: 48.3 % (ref 39.0–52.0)
Hemoglobin: 16.7 g/dL (ref 13.0–17.0)
MCH: 29.6 pg (ref 26.0–34.0)
MCHC: 34.6 g/dL (ref 30.0–36.0)
MCV: 85.6 fL (ref 80.0–100.0)
Platelets: 186 K/uL (ref 150–400)
RBC: 5.64 MIL/uL (ref 4.22–5.81)
RDW: 13.6 % (ref 11.5–15.5)
WBC: 11.6 K/uL — ABNORMAL HIGH (ref 4.0–10.5)
nRBC: 0 % (ref 0.0–0.2)

## 2023-10-22 MED ORDER — SODIUM CHLORIDE 0.9 % IV BOLUS
1000.0000 mL | Freq: Once | INTRAVENOUS | Status: AC
  Administered 2023-10-22: 1000 mL via INTRAVENOUS

## 2023-10-22 MED ORDER — HYDROMORPHONE HCL 1 MG/ML IJ SOLN
1.0000 mg | Freq: Once | INTRAMUSCULAR | Status: AC
  Administered 2023-10-22: 1 mg via INTRAVENOUS
  Filled 2023-10-22: qty 1

## 2023-10-22 MED ORDER — ONDANSETRON 8 MG PO TBDP
8.0000 mg | ORAL_TABLET | Freq: Three times a day (TID) | ORAL | 0 refills | Status: AC | PRN
  Filled 2023-10-22: qty 20, 7d supply, fill #0

## 2023-10-22 MED ORDER — ONDANSETRON HCL 4 MG/2ML IJ SOLN
4.0000 mg | Freq: Once | INTRAMUSCULAR | Status: AC
  Administered 2023-10-22: 4 mg via INTRAVENOUS
  Filled 2023-10-22: qty 2

## 2023-10-22 MED ORDER — HYDROCODONE-ACETAMINOPHEN 5-325 MG PO TABS
1.0000 | ORAL_TABLET | Freq: Four times a day (QID) | ORAL | 0 refills | Status: DC | PRN
  Filled 2023-10-22: qty 14, 4d supply, fill #0

## 2023-10-22 NOTE — Discharge Instructions (Signed)
 When you contact [facility] you can see if they can see you up in [place].  Take the pain medicine as directed take the antinausea medicine as directed return for any new or worse symptoms.  To include fevers or persistent vomiting.  CT scan did confirm a left 7 mm ureteral stone.

## 2023-10-22 NOTE — ED Triage Notes (Signed)
 Patient states history of kidney stones. Pain today to left flank.

## 2023-10-22 NOTE — ED Notes (Signed)
 Pt unable to provide UA. Urinal at placed at bedside.

## 2023-10-22 NOTE — ED Provider Notes (Addendum)
 [place] EMERGENCY DEPARTMENT AT [facility] Provider Note   CSN: [id number] Arrival date & time: 10/22/23  1233     Patient presents with: Flank Pain   Alex Newman is a 53 y.o. male.   Patient with onset of left flank pain maybe a couple weeks ago.  But then was not real bad and then got better.  Got a lot worse this morning.  With severe pain.  Of left CVA left flank area.  History of kidney stones multiple stones in the past.  Temp here 98.2 pulse 100 respirations 22 blood pressure 163/98 oxygen sats 100% on room air.  Past medical history significant for hypothyroidism hyperlipidemia history of kidney stones obesity kidney stone removal x 2 sometime in the past.  Has never used tobacco products.       Prior to Admission medications   Medication Sig Start Date End Date Taking? Authorizing Provider  atorvastatin  (LIPITOR) 20 MG tablet Take 1 tablet (20 mg total) by mouth daily. 01/10/22   Copland, Jacques, MD  Blood Glucose Monitoring Suppl (CONTOUR BLOOD GLUCOSE SYSTEM) w/Device KIT by Does not apply route.    [provider]  CONTOUR NEXT TEST test strip USE TO CHECK BLOOD SUGAR TWO TIMES A DAY 04/20/22   Copland, Jacques, MD  cyclobenzaprine  (FLEXERIL ) 10 MG tablet Take 1 tablet (10 mg total) by mouth 2 (two) times daily as needed for muscle spasms. 06/13/20   Corlis Burnard DEL, NP  ibuprofen  (ADVIL ) 600 MG tablet Take 1 tablet (600 mg total) by mouth every 6 (six) hours as needed. 06/13/20   Corlis Burnard DEL, NP  levothyroxine  (SYNTHROID ) 75 MCG tablet TAKE 1 TABLET BY MOUTH EVERY DAY 01/10/22   Copland, Jacques, MD  metFORMIN  (GLUCOPHAGE -XR) 500 MG 24 hr tablet TAKE 1 TABLET BY MOUTH EVERY DAY WITH BREAKFAST 09/17/22   Copland, Jacques, MD  Microlet Lancets MISC Use to check blood sugar two times a day 03/07/21   Copland, Jacques, MD  ondansetron  (ZOFRAN -ODT) 8 MG disintegrating tablet DISSOLVE ONE TABLET IN THE MOUTH EVERY 8HOURS AS NEEDED FOR NAUSEA AND VOMITING.  10/31/20   Copland, Jacques, MD  OZEMPIC, 0.25 OR 0.5 MG/DOSE, 2 MG/3ML SOPN SMARTSIG:0.25 Milligram(s) SUB-Q Once a Week    [provider]    Allergies: Cetirizine hcl, Penicillins, and Propoxyphene n-acetaminophen     Review of Systems  Constitutional:  Negative for chills and fever.  HENT:  Negative for ear pain and sore throat.   Eyes:  Negative for pain and visual disturbance.  Respiratory:  Negative for cough and shortness of breath.   Cardiovascular:  Negative for chest pain and palpitations.  Gastrointestinal:  Positive for nausea. Negative for abdominal pain and vomiting.  Genitourinary:  Positive for flank pain. Negative for dysuria and hematuria.  Musculoskeletal:  Positive for back pain. Negative for arthralgias.  Skin:  Negative for color change and rash.  Neurological:  Negative for seizures and syncope.  All other systems reviewed and are negative.   Updated Vital Signs BP 129/85 (BP Location: Right Arm)   Pulse 91   Temp 98.2 F (36.8 C) (Oral)   Resp 17   SpO2 96%   Physical Exam Vitals and nursing note reviewed.  Constitutional:      General: He is in acute distress.     Appearance: Normal appearance. He is well-developed.  HENT:     Head: Normocephalic and atraumatic.  Eyes:     Extraocular Movements: Extraocular movements intact.  Conjunctiva/sclera: Conjunctivae normal.     Pupils: Pupils are equal, round, and reactive to light.  Cardiovascular:     Rate and Rhythm: Normal rate and regular rhythm.     Heart sounds: No murmur heard. Pulmonary:     Effort: Pulmonary effort is normal. No respiratory distress.     Breath sounds: Normal breath sounds.  Abdominal:     Palpations: Abdomen is soft.     Tenderness: There is no abdominal tenderness.  Musculoskeletal:        General: No swelling.     Cervical back: Neck supple.  Skin:    General: Skin is warm and dry.     Capillary Refill: Capillary refill takes less than 2 seconds.   Neurological:     General: No focal deficit present.     Mental Status: He is alert and oriented to person, place, and time.  Psychiatric:        Mood and Affect: Mood normal.     (all labs ordered are listed, but only abnormal results are displayed) Labs Reviewed  BASIC METABOLIC PANEL WITH GFR - Abnormal; Notable for the following components:      Result Value   CO2 20 (*)    Glucose, Bld 157 (*)    All other components within normal limits  CBC - Abnormal; Notable for the following components:   WBC 11.6 (*)    All other components within normal limits  URINALYSIS, ROUTINE W REFLEX MICROSCOPIC    EKG: None  Radiology: CT Renal Stone Study Result Date: 10/22/2023 CLINICAL DATA:  Acute left flank pain. EXAM: CT ABDOMEN AND PELVIS WITHOUT CONTRAST TECHNIQUE: Multidetector CT imaging of the abdomen and pelvis was performed following the standard protocol without IV contrast. RADIATION DOSE REDUCTION: This exam was performed according to the departmental dose-optimization program which includes automated exposure control, adjustment of the mA and/or kV according to patient size and/or use of iterative reconstruction technique. COMPARISON:  March 25, 2018. FINDINGS: Lower chest: No acute abnormality. Hepatobiliary: Hepatic steatosis. No cholelithiasis or biliary dilatation. Pancreas: Unremarkable. No pancreatic ductal dilatation or surrounding inflammatory changes. Spleen: Normal in size without focal abnormality. Adrenals/Urinary Tract: Adrenal glands appear normal. Bilateral nephrolithiasis is noted. Mild left hydronephrosis is noted secondary to 7 mm proximal left ureteral calculus. Urinary bladder is unremarkable. Stomach/Bowel: Stomach is within normal limits. Appendix appears normal. No evidence of bowel wall thickening, distention, or inflammatory changes. Vascular/Lymphatic: Aortic atherosclerosis. No enlarged abdominal or pelvic lymph nodes. Reproductive: Prostate is unremarkable.  Other: No ascites or hernia is noted. Musculoskeletal: No acute or significant osseous findings. IMPRESSION: Bilateral nephrolithiasis. Mild left hydronephrosis secondary to 7 mm proximal left ureteral calculus. Hepatic steatosis. Electronically Signed   By: Lynwood Landy Raddle M.D.   On: 10/22/2023 14:40     Procedures   Medications Ordered in the ED  sodium chloride  0.9 % bolus 1,000 mL (1,000 mLs Intravenous New Bag/Given 10/22/23 1428)  ondansetron  (ZOFRAN ) injection 4 mg (4 mg Intravenous Given 10/22/23 1428)  HYDROmorphone  (DILAUDID ) injection 1 mg (1 mg Intravenous Given 10/22/23 1429)                                    Medical Decision Making Amount and/or Complexity of Data Reviewed Labs: ordered. Radiology: ordered.  Risk Prescription drug management.   Will get CT renal, will give a liter of fluids pain medicine and nausea medicine.  Patient metabolic  panel significant for renal function that is normal with a GFR greater than 60.  Electrolytes normal.  Blood sugar 157.  CBC white count 11.6 hemoglobin 16.7 platelets 186.  Urinalysis pending.  Clinically consistent with a left sided kidney stone.  Will see what the scan shows.  CT scan shows a proximal 7 mm left ureteral stone which explains his symptoms.  Patient feeling bit better after pain medicine.  Not able to provide a urine.  Urinalysis is not critical based on his symptoms.  Will give him referral to [facility] sounds like he saw them before in the [place] area.  Will send hydrocodone  and Zofran  ODT to the pharmacy here for him to pick up.  Final diagnoses:  Left flank pain  Left ureteral stone    ED Discharge Orders     None          Geraldene Hamilton, MD 10/22/23 1356    Geraldene Hamilton, MD 10/22/23 [phone]

## 2023-10-28 ENCOUNTER — Ambulatory Visit
Admission: RE | Admit: 2023-10-28 | Discharge: 2023-10-28 | Disposition: A | Discharge status: Home / Self Care | Source: Ambulatory Visit | Attending: Urology | Admitting: Urology

## 2023-10-28 ENCOUNTER — Other Ambulatory Visit: Payer: Self-pay | Admitting: *Deleted

## 2023-10-28 DIAGNOSIS — I878 Other specified disorders of veins: Secondary | ICD-10-CM | POA: Diagnosis not present

## 2023-10-28 DIAGNOSIS — N201 Calculus of ureter: Secondary | ICD-10-CM | POA: Diagnosis not present

## 2023-10-28 DIAGNOSIS — N200 Calculus of kidney: Secondary | ICD-10-CM | POA: Insufficient documentation

## 2023-10-29 ENCOUNTER — Encounter: Payer: Self-pay | Admitting: Urology

## 2023-10-29 ENCOUNTER — Ambulatory Visit (INDEPENDENT_AMBULATORY_CARE_PROVIDER_SITE_OTHER): Admitting: Urology

## 2023-10-29 VITALS — BP 143/88 | HR 72 | Ht 71.0 in | Wt 315.0 lb

## 2023-10-29 DIAGNOSIS — N200 Calculus of kidney: Secondary | ICD-10-CM

## 2023-10-29 DIAGNOSIS — N202 Calculus of kidney with calculus of ureter: Secondary | ICD-10-CM

## 2023-10-29 DIAGNOSIS — N132 Hydronephrosis with renal and ureteral calculous obstruction: Secondary | ICD-10-CM

## 2023-10-29 DIAGNOSIS — N201 Calculus of ureter: Secondary | ICD-10-CM

## 2023-10-29 LAB — URINALYSIS, COMPLETE
Bilirubin, UA: NEGATIVE
Glucose, UA: NEGATIVE
Ketones, UA: NEGATIVE
Nitrite, UA: NEGATIVE
Protein,UA: NEGATIVE
Specific Gravity, UA: 1.02 (ref 1.005–1.030)
Urobilinogen, Ur: 0.2 mg/dL (ref 0.2–1.0)
pH, UA: 6 (ref 5.0–7.5)

## 2023-10-29 LAB — MICROSCOPIC EXAMINATION

## 2023-10-29 MED ORDER — TAMSULOSIN HCL 0.4 MG PO CAPS: 0.4000 mg | ORAL_CAPSULE | Freq: Every day | ORAL | 0 refills | Status: DC

## 2023-10-29 NOTE — Progress Notes (Signed)
 10/29/2023 8:27 AM   Alex Newman [birth year] [id number]  Referring provider: No referring provider defined for this encounter.  Chief Complaint  Patient presents with   Nephrolithiasis    HPI: Alex Newman is a 53 y.o. male presents in follow-up of recent ED visit for renal colic  Presented to [facility] 10/22/2023 with a 2-week history of left flank pain that acutely worsened.  Pain nonradiating without precipitating, aggravating or alleviating factors Positive nausea.  No fever/vomiting Stone protocol CT performed which showed a 7 mm left proximal ureteral calculus with mild hydronephrosis and bilateral, nonobstructing renal calculi Symptoms improved with parenteral analgesic/antibiotics Discharged on hydrocodone  and Zofran  Minimal pain since ED visit Was not started on tamsulosin  Has had 2 previous SWL; last >10 years ago  PMH: Past Medical History:  Diagnosis Date   Allergic rhinitis due to pollen    GERD (gastroesophageal reflux disease) 02/11/2013   Hyperlipidemia    Hypothyroidism    Kidney stone    Morbid obesity with BMI of 45.0-49.9, adult (HCC)    Nephrolithiasis     Surgical History: Past Surgical History:  Procedure Laterality Date   GANGLION CYST EXCISION Right    wrist x 2   kidney stone removal     x2    Home Medications:  Allergies as of 10/29/2023       Reactions   Cetirizine Hcl    REACTION: Headache   Penicillins    Propoxyphene N-acetaminophen     REACTION: ha, nausea        Medication List        Accurate as of October 29, 2023  8:27 AM. If you have any questions, ask your nurse or doctor.          atorvastatin  20 MG tablet Commonly known as: LIPITOR Take 1 tablet (20 mg total) by mouth daily.   Contour Blood Glucose System w/Device Kit by Does not apply route.   Contour Next Test test strip Generic drug: glucose blood USE TO CHECK BLOOD SUGAR TWO TIMES A DAY   cyclobenzaprine  10 MG  tablet Commonly known as: FLEXERIL  Take 1 tablet (10 mg total) by mouth 2 (two) times daily as needed for muscle spasms.   HYDROcodone -acetaminophen  5-325 MG tablet Commonly known as: NORCO/VICODIN Take 1 tablet by mouth every 6 (six) hours as needed for moderate pain (pain score 4-6).   ibuprofen  600 MG tablet Commonly known as: ADVIL  Take 1 tablet (600 mg total) by mouth every 6 (six) hours as needed.   levothyroxine  75 MCG tablet Commonly known as: SYNTHROID  TAKE 1 TABLET BY MOUTH EVERY DAY   metFORMIN  500 MG 24 hr tablet Commonly known as: GLUCOPHAGE -XR TAKE 1 TABLET BY MOUTH EVERY DAY WITH BREAKFAST   Microlet Lancets Misc Use to check blood sugar two times a day   ondansetron  8 MG disintegrating tablet Commonly known as: ZOFRAN -ODT DISSOLVE ONE TABLET IN THE MOUTH EVERY 8HOURS AS NEEDED FOR NAUSEA AND VOMITING.   ondansetron  8 MG disintegrating tablet Commonly known as: ZOFRAN -ODT Take 1 tablet (8 mg total) by mouth every 8 (eight) hours as needed for nausea or vomiting.   Ozempic (0.25 or 0.5 MG/DOSE) 2 MG/3ML Sopn Generic drug: Semaglutide(0.25 or 0.5MG /DOS) SMARTSIG:0.25 Milligram(s) SUB-Q Once a Week        Allergies:  Allergies  Allergen Reactions   Cetirizine Hcl     REACTION: Headache   Penicillins    Propoxyphene N-Acetaminophen      REACTION: ha, nausea  Family History: Family History  Problem Relation Age of Onset   Thyroid  disease Mother    Bipolar disorder Mother    Heart disease Father    Neuropathy Father    Stroke Maternal Grandfather    Lung cancer Maternal Grandmother     Social History:  reports that he has never smoked. He has never used smokeless tobacco. He reports that he does not drink alcohol and does not use drugs.   Physical Exam: BP (!) 143/88   Pulse 72   Ht 5' 11 (1.803 m)   Wt (!) 315 lb (142.9 kg)   BMI 43.93 kg/m   Constitutional:  Alert, No acute distress. HEENT: [place] AT Respiratory: Normal respiratory  effort, no increased work of breathing. Psychiatric: Normal mood and affect.     Pertinent Imaging: CT images were personally reviewed and interpreted.  Stone density: 600-800 HU/SSD >10 cm.   A KUB performed yesterday was personally viewed interpreted and the calculus is visualized on KUB  CT Renal Stone Study  Narrative CLINICAL DATA:  Acute left flank pain.  EXAM: CT ABDOMEN AND PELVIS WITHOUT CONTRAST  TECHNIQUE: Multidetector CT imaging of the abdomen and pelvis was performed following the standard protocol without IV contrast.  RADIATION DOSE REDUCTION: This exam was performed according to the departmental dose-optimization program which includes automated exposure control, adjustment of the mA and/or kV according to patient size and/or use of iterative reconstruction technique.  COMPARISON:  March 25, 2018.  FINDINGS: Lower chest: No acute abnormality.  Hepatobiliary: Hepatic steatosis. No cholelithiasis or biliary dilatation.  Pancreas: Unremarkable. No pancreatic ductal dilatation or surrounding inflammatory changes.  Spleen: Normal in size without focal abnormality.  Adrenals/Urinary Tract: Adrenal glands appear normal. Bilateral nephrolithiasis is noted. Mild left hydronephrosis is noted secondary to 7 mm proximal left ureteral calculus. Urinary bladder is unremarkable.  Stomach/Bowel: Stomach is within normal limits. Appendix appears normal. No evidence of bowel wall thickening, distention, or inflammatory changes.  Vascular/Lymphatic: Aortic atherosclerosis. No enlarged abdominal or pelvic lymph nodes.  Reproductive: Prostate is unremarkable.  Other: No ascites or hernia is noted.  Musculoskeletal: No acute or significant osseous findings.  IMPRESSION: Bilateral nephrolithiasis. Mild left hydronephrosis secondary to 7 mm proximal left ureteral calculus.  Hepatic steatosis.   Electronically Signed By: Lynwood Landy Raddle M.D. On: 10/22/2023  14:40   Assessment & Plan:    1.  Left ureteral calculus We discussed various treatment options for urolithiasis including observation with or without medical expulsive therapy, shockwave lithotripsy (SWL), ureteroscopy and laser lithotripsy with stent placement We discussed that management is based on stone size, location, density, patient co-morbidities, and patient preference.  Stones <70mm in size have a >80% spontaneous passage rate. Data surrounding the use of tamsulosin  for medical expulsive therapy is controversial, but meta analyses suggests it is most efficacious for distal stones between 5-54mm in size.  SWL has a lower stone free rate in a single procedure, but also a lower complication rate compared to ureteroscopy and avoids a stent and associated stent related symptoms. Possible complications include renal hematoma, steinstrasse, and need for additional treatment. Ureteroscopy with laser lithotripsy and stent placement has a higher stone free rate than SWL in a single procedure, however increased complication rate including possible infection, ureteral injury, bleeding, and stent related morbidity. Common stent related symptoms include dysuria, urgency/frequency, and flank pain. After an extensive discussion of the risks and benefits of the above treatment options, the patient would like to proceed with medical expulsion therapy.  Rx tamsulosin  sent to pharmacy. KUB 1 week  2.  Bilateral nephrolithiasis   Alex Alex Barba, MD  [facility] [address], [address] [place], [place] [address] [phone]

## 2023-11-09 ENCOUNTER — Ambulatory Visit: Payer: Self-pay | Admitting: Urology

## 2023-11-11 ENCOUNTER — Ambulatory Visit
Admission: RE | Admit: 2023-11-11 | Discharge: 2023-11-11 | Disposition: A | Discharge status: Home / Self Care | Source: Ambulatory Visit | Attending: Urology | Admitting: Urology

## 2023-11-11 ENCOUNTER — Ambulatory Visit
Admission: RE | Admit: 2023-11-11 | Discharge: 2023-11-11 | Disposition: A | Discharge status: Home / Self Care | Attending: Urology | Admitting: Urology

## 2023-11-11 DIAGNOSIS — N201 Calculus of ureter: Secondary | ICD-10-CM | POA: Diagnosis not present

## 2023-11-11 DIAGNOSIS — N202 Calculus of kidney with calculus of ureter: Secondary | ICD-10-CM | POA: Diagnosis not present

## 2023-11-15 ENCOUNTER — Telehealth: Payer: Self-pay

## 2023-11-15 DIAGNOSIS — N201 Calculus of ureter: Secondary | ICD-10-CM

## 2023-11-15 NOTE — Telephone Encounter (Signed)
 Pt called with concerns that he had not been called with results yet from his X-ray. I let him know that the radiologist had not read them yet and we would get back to him with results after they had been read. Pt states he's leaving town next weekend and he has concerns. He states no pain at this time. Pt was instructed that if he starts having severe pain with this current stone that he should seek attention in the ER. Pt voiced understanding

## 2023-11-15 NOTE — Telephone Encounter (Signed)
 Stone has not progressed on serial KUBs from its location on initial CT.  Based on nonprogression it is unlikely he will pass.  We discussed options of shockwave lithotripsy and ureteroscopy at our initial office visit.  Does he have any questions regarding these procedures or a preference?

## 2023-11-18 NOTE — Telephone Encounter (Signed)
 Left message for patient to return the call.

## 2023-11-18 NOTE — Telephone Encounter (Signed)
 Pt returned call and would like a call back to discuss options.  He has an upcoming vacation planned.

## 2023-11-20 ENCOUNTER — Telehealth: Payer: Self-pay

## 2023-11-20 ENCOUNTER — Other Ambulatory Visit: Payer: Self-pay

## 2023-11-20 DIAGNOSIS — N201 Calculus of ureter: Secondary | ICD-10-CM

## 2023-11-20 NOTE — Progress Notes (Signed)
 ESWL ORDER FORM  Expected date of procedure: Patient preference-on GLP-1  Surgeon: Scheduled  Post op standing: 2-4wk follow up w/KUB prior  Anticoagulation/Aspirin/NSAID standing order: Hold all 72 hours prior  Anesthesia standing order: MAC  VTE standing: SCD's  Dx: Left Ureteral Stone  Procedure: left Extracorporeal shock wave lithotripsy  CPT : 50590  Standing Order Set:   *NPO after mn, KUB  *NS 1101ml/hr, Keflex  500mg  PO, Benadryl 25mg  PO, Valium 10mg  PO, Zofran  4mg  IV  Medications if other than standing orders:   NONE

## 2023-11-20 NOTE — Progress Notes (Signed)
    [facility] ESWL POSTING SHEET        Patient Name: Alex Newman  DOB: 1970/09/14  MRN: [id number]  Surgeon:  Dr. Penne Skye, MD  Diagnosis:  Left Ureteral Stone  CPT: 49409  ESWL DATE: 12/05/2023  ESWL TIME: 0730am  Special Needs/Requirements: None       Cardiac/Medical/Pulmonary Clearance needed: No       Form Faxed to [facility]- [phone] Date:   Date: 11/20/23       Form Faxed to [facility]- [phone]  Date:  Date: 11/20/23           Copy Made for Insurance PA:  Date: 11/20/23       Orders Entered in to Epic:  Date: 11/20/23                  \

## 2023-11-20 NOTE — Telephone Encounter (Signed)
 Information sent to patient about holding Ozempic prior to surgery.

## 2023-11-22 ENCOUNTER — Other Ambulatory Visit: Payer: Self-pay | Admitting: Urology

## 2023-12-05 ENCOUNTER — Encounter: Payer: Self-pay | Admitting: Urology

## 2023-12-05 ENCOUNTER — Ambulatory Visit

## 2023-12-05 ENCOUNTER — Encounter
Admission: RE | Disposition: A | Discharge status: Home / Self Care | Payer: Self-pay | Source: Home / Self Care | Attending: Urology

## 2023-12-05 ENCOUNTER — Other Ambulatory Visit: Payer: Self-pay

## 2023-12-05 ENCOUNTER — Ambulatory Visit
Admission: RE | Admit: 2023-12-05 | Discharge: 2023-12-05 | Disposition: A | Discharge status: Home / Self Care | Attending: Urology | Admitting: Urology

## 2023-12-05 DIAGNOSIS — N202 Calculus of kidney with calculus of ureter: Secondary | ICD-10-CM | POA: Diagnosis not present

## 2023-12-05 DIAGNOSIS — N201 Calculus of ureter: Secondary | ICD-10-CM | POA: Diagnosis not present

## 2023-12-05 DIAGNOSIS — I878 Other specified disorders of veins: Secondary | ICD-10-CM | POA: Diagnosis not present

## 2023-12-05 HISTORY — PX: EXTRACORPOREAL SHOCK WAVE LITHOTRIPSY: SHX1557

## 2023-12-05 SURGERY — LITHOTRIPSY, ESWL
Anesthesia: Moderate Sedation | Laterality: Left

## 2023-12-05 MED ORDER — DIAZEPAM 5 MG PO TABS
ORAL_TABLET | ORAL | Status: AC
Start: 2023-12-05 — End: 2023-12-05
  Filled 2023-12-05: qty 2

## 2023-12-05 MED ORDER — ONDANSETRON HCL 4 MG/2ML IJ SOLN
INTRAMUSCULAR | Status: AC
  Filled 2023-12-05: qty 2

## 2023-12-05 MED ORDER — CEPHALEXIN 500 MG PO CAPS
ORAL_CAPSULE | ORAL | Status: AC
  Filled 2023-12-05: qty 1

## 2023-12-05 MED ORDER — DIPHENHYDRAMINE HCL 25 MG PO CAPS
25.0000 mg | ORAL_CAPSULE | ORAL | Status: AC
  Administered 2023-12-05: 25 mg via ORAL

## 2023-12-05 MED ORDER — SODIUM CHLORIDE 0.9 % IV SOLN: INTRAVENOUS | Status: DC

## 2023-12-05 MED ORDER — ONDANSETRON HCL 4 MG/2ML IJ SOLN
4.0000 mg | Freq: Once | INTRAMUSCULAR | Status: AC
  Administered 2023-12-05: 4 mg via INTRAVENOUS

## 2023-12-05 MED ORDER — DIAZEPAM 5 MG PO TABS
10.0000 mg | ORAL_TABLET | ORAL | Status: AC
  Administered 2023-12-05: 10 mg via ORAL

## 2023-12-05 MED ORDER — CEPHALEXIN 500 MG PO CAPS
500.0000 mg | ORAL_CAPSULE | Freq: Once | ORAL | Status: AC
  Administered 2023-12-05: 500 mg via ORAL

## 2023-12-05 MED ORDER — DIPHENHYDRAMINE HCL 25 MG PO CAPS
ORAL_CAPSULE | ORAL | Status: AC
  Filled 2023-12-05: qty 1

## 2023-12-05 NOTE — H&P (Signed)
 12/05/2023 8:43 AM   Alex Newman 09/15/1970 [id number]    HPI: Alex Newman is a 53 y.o. male who presents for shockwave lithotripsy of a left proximal ureteral calculus.  Presented to [facility] 10/22/2023 with a 2-week history of left flank pain that acutely worsened.  Pain nonradiating without precipitating, aggravating or alleviating factors Positive nausea.  No fever/vomiting Stone protocol CT performed which showed a 7 mm left proximal ureteral calculus with mild hydronephrosis and bilateral, nonobstructing renal calculi Symptoms improved with parenteral analgesic/antibiotics Discharged on hydrocodone  and Zofran  Minimal pain since ED visit Was not started on tamsulosin  Has had 2 previous SWL; last >10 years ago Has been pain-free for the past several weeks however stone still present on KUB   PMH: Past Medical History:  Diagnosis Date   Allergic rhinitis due to pollen    GERD (gastroesophageal reflux disease) 02/11/2013   Hyperlipidemia    Hypothyroidism    Kidney stone    Morbid obesity with BMI of 45.0-49.9, adult (HCC)    Nephrolithiasis     Surgical History: Past Surgical History:  Procedure Laterality Date   GANGLION CYST EXCISION Right    wrist x 2   kidney stone removal     x2    Home Medications:  atorvastatin  20 MG tablet Commonly known as: LIPITOR Take 1 tablet (20 mg total) by mouth daily.    Contour Blood Glucose System w/Device Kit by Does not apply route.    Contour Next Test test strip Generic drug: glucose blood USE TO CHECK BLOOD SUGAR TWO TIMES A DAY    cyclobenzaprine  10 MG tablet Commonly known as: FLEXERIL  Take 1 tablet (10 mg total) by mouth 2 (two) times daily as needed for muscle spasms.    HYDROcodone -acetaminophen  5-325 MG tablet Commonly known as: NORCO/VICODIN Take 1 tablet by mouth every 6 (six) hours as needed for moderate pain (pain score 4-6).    ibuprofen  600 MG tablet Commonly known as:  ADVIL  Take 1 tablet (600 mg total) by mouth every 6 (six) hours as needed.    levothyroxine  75 MCG tablet Commonly known as: SYNTHROID  TAKE 1 TABLET BY MOUTH EVERY DAY    metFORMIN  500 MG 24 hr tablet Commonly known as: GLUCOPHAGE -XR TAKE 1 TABLET BY MOUTH EVERY DAY WITH BREAKFAST    Microlet Lancets Misc Use to check blood sugar two times a day    ondansetron  8 MG disintegrating tablet Commonly known as: ZOFRAN -ODT DISSOLVE ONE TABLET IN THE MOUTH EVERY 8HOURS AS NEEDED FOR NAUSEA AND VOMITING.    ondansetron  8 MG disintegrating tablet Commonly known as: ZOFRAN -ODT Take 1 tablet (8 mg total) by mouth every 8 (eight) hours as needed for nausea or vomiting.    Ozempic (0.25 or 0.5 MG/DOSE) 2 MG/3ML Sopn Generic drug: Semaglutide(0.25 or 0.5MG /DOS) SMARTSIG:0.25 Milligram(s) SUB-Q Once a Week      Allergies:  Allergies  Allergen Reactions   Cetirizine Hcl     REACTION: Headache   Penicillins    Propoxyphene N-Acetaminophen      REACTION: ha, nausea    Family History: Family History  Problem Relation Age of Onset   Thyroid  disease Mother    Bipolar disorder Mother    Heart disease Father    Neuropathy Father    Stroke Maternal Grandfather    Lung cancer Maternal Grandmother     Social History:  reports that he has never smoked. He has never used smokeless tobacco. He reports that he does not drink  alcohol and does not use drugs.   Physical Exam: BP 127/82   Pulse 84   Temp 98.6 F (37 C) (Oral)   Resp 20   Ht 5' 11 (1.803 m)   Wt (!) 142.9 kg   SpO2 98%   BMI 43.93 kg/m   Constitutional:  Alert, No acute distress. HEENT: [place] AT Respiratory: Normal respiratory effort, no increased work of breathing. Psychiatric: Normal mood and affect.    Abdomen 1 view (KUB)  Narrative EXAM: 1 VIEW XRAY OF THE ABDOMEN 11/11/2023 12:50:12 PM  COMPARISON: 10/28/2023  CLINICAL HISTORY: Nephrolithiasis. Nephrolithiasis, Left ureteral calculus. Pt states follow  up, no pain/ symptoms at this time.  FINDINGS:  BOWEL: Nonobstructive bowel gas pattern.  SOFT TISSUES: 4 mm calculus overlies the right lower pole. 3 mm calculus overlies the interpolar region of the left kidney. 5 mm calculus at the level of the left L3 transverse process unchanged, within the mid left ureter. Unchanged pelvic calcifications, likely phleboliths.  BONES: No acute osseous abnormality.  IMPRESSION: 1. Stable 5 mm calculus in the mid left ureter at the level of the left L3 transverse process. 2. 4 mm calculus in the right lower pole. 3. 3 mm calculus in the interpolar region of the left kidney.  Electronically signed by: Dorethia Molt MD 11/18/2023 12:50 AM EDT RP  CT Renal Stone Study  Narrative CLINICAL DATA:  Acute left flank pain.  EXAM: CT ABDOMEN AND PELVIS WITHOUT CONTRAST  TECHNIQUE: Multidetector CT imaging of the abdomen and pelvis was performed following the standard protocol without IV contrast.  RADIATION DOSE REDUCTION: This exam was performed according to the departmental dose-optimization program which includes automated exposure control, adjustment of the mA and/or kV according to patient size and/or use of iterative reconstruction technique.  COMPARISON:  March 25, 2018.  FINDINGS: Lower chest: No acute abnormality.  Hepatobiliary: Hepatic steatosis. No cholelithiasis or biliary dilatation.  Pancreas: Unremarkable. No pancreatic ductal dilatation or surrounding inflammatory changes.  Spleen: Normal in size without focal abnormality.  Adrenals/Urinary Tract: Adrenal glands appear normal. Bilateral nephrolithiasis is noted. Mild left hydronephrosis is noted secondary to 7 mm proximal left ureteral calculus. Urinary bladder is unremarkable.  Stomach/Bowel: Stomach is within normal limits. Appendix appears normal. No evidence of bowel wall thickening, distention, or inflammatory changes.  Vascular/Lymphatic: Aortic  atherosclerosis. No enlarged abdominal or pelvic lymph nodes.  Reproductive: Prostate is unremarkable.  Other: No ascites or hernia is noted.  Musculoskeletal: No acute or significant osseous findings.  IMPRESSION: Bilateral nephrolithiasis. Mild left hydronephrosis secondary to 7 mm proximal left ureteral calculus.  Hepatic steatosis.   Electronically Signed By: Lynwood Landy Raddle M.D. On: 10/22/2023 14:40   Assessment & Plan:    1.  Left proximal ureteral calculus Presents for shockwave lithotripsy.  The procedure has been discussed in detail.  All questions were answered and he desires to proceed   Glendia JAYSON Barba, MD  [facility] [address] [place], [place] [address] [phone]

## 2023-12-05 NOTE — Interval H&P Note (Signed)
 History and Physical Interval Note:  12/05/2023 8:52 AM  Alex Newman  has presented today for surgery, with the diagnosis of Left Ureteral Stone.  The various methods of treatment have been discussed with the patient and family. After consideration of risks, benefits and other options for treatment, the patient has consented to  Procedure(s): LITHOTRIPSY, ESWL (Left) as a surgical intervention.  The patient's history has been reviewed, patient examined, no change in status, stable for surgery.  I have reviewed the patient's chart and labs.  Questions were answered to the patient's satisfaction.    CV:RRR Lungs:clear   Alex Newman

## 2023-12-05 NOTE — Discharge Instructions (Addendum)
 As per the [facility] discharge instructions Continue pain medication as needed continue tamsulosin  which will help you pass stone fragments  Call [facility] at [phone] for pain not controlled with oral medications or fever greater than 101 degrees You will be contacted for a follow-up appointment in ~ 2 weeks

## 2023-12-06 ENCOUNTER — Encounter: Payer: Self-pay | Admitting: Urology

## 2023-12-12 ENCOUNTER — Telehealth: Payer: Self-pay | Admitting: *Deleted

## 2023-12-12 NOTE — Telephone Encounter (Signed)
 Talked with patient on the phone today. He states he has not passed no stones yet. He wanted to know what size the stones was broken down into. I advised that some of the stones can be broken down to sand particles like dust. Ask patient if he was having any symptoms,  pain or blood. He states he is not . Advised patient he needs to get a x-ray prior to his appointed on the 23 of this month . Keep pushing fluids and taking his flomax 

## 2023-12-16 ENCOUNTER — Encounter: Payer: Self-pay | Admitting: Urology

## 2023-12-26 ENCOUNTER — Ambulatory Visit: Admitting: Physician Assistant

## 2023-12-30 ENCOUNTER — Ambulatory Visit
Admission: RE | Admit: 2023-12-30 | Discharge: 2023-12-30 | Disposition: A | Discharge status: Home / Self Care | Source: Ambulatory Visit | Attending: Urology | Admitting: Urology

## 2023-12-30 ENCOUNTER — Ambulatory Visit
Admission: RE | Admit: 2023-12-30 | Discharge: 2023-12-30 | Disposition: A | Discharge status: Home / Self Care | Attending: Urology | Admitting: Urology

## 2023-12-30 DIAGNOSIS — N201 Calculus of ureter: Secondary | ICD-10-CM | POA: Insufficient documentation

## 2023-12-30 DIAGNOSIS — I878 Other specified disorders of veins: Secondary | ICD-10-CM | POA: Diagnosis not present

## 2023-12-30 DIAGNOSIS — N2889 Other specified disorders of kidney and ureter: Secondary | ICD-10-CM | POA: Diagnosis not present

## 2023-12-31 ENCOUNTER — Ambulatory Visit: Admitting: Physician Assistant

## 2023-12-31 VITALS — BP 131/85 | HR 88 | Ht 71.0 in | Wt 315.0 lb

## 2023-12-31 DIAGNOSIS — N201 Calculus of ureter: Secondary | ICD-10-CM | POA: Diagnosis not present

## 2023-12-31 LAB — URINALYSIS, COMPLETE
Bilirubin, UA: NEGATIVE
Glucose, UA: NEGATIVE
Ketones, UA: NEGATIVE
Nitrite, UA: POSITIVE — AB
Specific Gravity, UA: 1.03 (ref 1.005–1.030)
Urobilinogen, Ur: 0.2 mg/dL (ref 0.2–1.0)
pH, UA: 6 (ref 5.0–7.5)

## 2023-12-31 LAB — MICROSCOPIC EXAMINATION: WBC, UA: >30 /HPF — AB (ref 0–5)

## 2023-12-31 MED ORDER — SULFAMETHOXAZOLE-TRIMETHOPRIM 800-160 MG PO TABS: 1.0000 | ORAL_TABLET | Freq: Two times a day (BID) | ORAL | 0 refills | Status: AC

## 2023-12-31 NOTE — Progress Notes (Unsigned)
 12/31/2023 11:00 AM   Alex Newman August 06, 1970 [id number]  CC: Chief Complaint  Patient presents with   Follow-up   Nephrolithiasis   HPI: Alex Newman is a 53 y.o. male with PMH nephrolithiasis who underwent ESWL with Dr. Twylla on 12/05/2023 for management of a 7 mm proximal left ureteral stone who presents today for follow up. Operative note describes smudging of the stone.   Today he reports he passed some blood, small clots, and 1 stone fragment and his pain resolved.  He denies dysuria today, though admits that he is rather prone to UTIs.  He describes a remote metabolic workup with [facility].  KUB yesterday with interval resolution of the proximal left ureteral stone.  In-office UA today positive for trace protein, 3+ blood, nitrates, and 2+ leukocytes; urine microscopy with >30 WBCs/HPF, 11-30 RBCs/HPF, and many bacteria.  PMH: Past Medical History:  Diagnosis Date   Allergic rhinitis due to pollen    GERD (gastroesophageal reflux disease) 02/11/2013   Hyperlipidemia    Hypothyroidism    Kidney stone    Morbid obesity with BMI of 45.0-49.9, adult [facility])    Nephrolithiasis     Surgical History: Past Surgical History:  Procedure Laterality Date   EXTRACORPOREAL SHOCK WAVE LITHOTRIPSY Left 12/05/2023   Procedure: LITHOTRIPSY, ESWL;  Surgeon: Twylla Glendia BROCKS, MD;  Location: [facility] ORS;  Service: Urology;  Laterality: Left;   GANGLION CYST EXCISION Right    wrist x 2   kidney stone removal     x2    Home Medications:  Allergies as of 12/31/2023       Reactions   Cetirizine Hcl    REACTION: Headache   Penicillins    Propoxyphene N-acetaminophen     REACTION: ha, nausea        Medication List        Accurate as of December 31, 2023 11:00 AM. If you have any questions, ask your nurse or doctor.          HYDROcodone -acetaminophen  5-325 MG tablet Commonly known as: NORCO/VICODIN Take 1 tablet by mouth every 6 (six) hours as needed for moderate pain  (pain score 4-6).   ibuprofen  600 MG tablet Commonly known as: ADVIL  Take 1 tablet (600 mg total) by mouth every 6 (six) hours as needed.   levothyroxine  75 MCG tablet Commonly known as: SYNTHROID  TAKE 1 TABLET BY MOUTH EVERY DAY   ondansetron  8 MG disintegrating tablet Commonly known as: ZOFRAN -ODT Take 1 tablet (8 mg total) by mouth every 8 (eight) hours as needed for nausea or vomiting.   Ozempic (0.25 or 0.5 MG/DOSE) 2 MG/3ML Sopn Generic drug: Semaglutide(0.25 or 0.5MG /DOS) SMARTSIG:0.25 Milligram(s) SUB-Q Once a Week   tamsulosin  0.4 MG Caps capsule Commonly known as: FLOMAX  TAKE 1 CAPSULE BY MOUTH EVERY DAY        Allergies:  Allergies  Allergen Reactions   Cetirizine Hcl     REACTION: Headache   Penicillins    Propoxyphene N-Acetaminophen      REACTION: ha, nausea    Family History: Family History  Problem Relation Age of Onset   Thyroid  disease Mother    Bipolar disorder Mother    Heart disease Father    Neuropathy Father    Stroke Maternal Grandfather    Lung cancer Maternal Grandmother     Social History:   reports that he has never smoked. He has never used smokeless tobacco. He reports that he does not drink alcohol and does not use drugs.  Physical Exam: BP 131/85 (BP Location: Left Arm, Patient Position: Sitting, Cuff Size: Large)   Pulse 88   Ht 5' 11 (1.803 m)   Wt (!) 315 lb (142.9 kg)   SpO2 98%   BMI 43.93 kg/m   Constitutional:  Alert and oriented, no acute distress, nontoxic appearing HEENT: [place], AT Cardiovascular: No clubbing, cyanosis, or edema Respiratory: Normal respiratory effort, no increased work of breathing Skin: No rashes, bruises or suspicious lesions Neurologic: Grossly intact, no focal deficits, moving all 4 extremities Psychiatric: Normal mood and affect  Laboratory Data: Results for orders placed or performed in visit on 12/31/23  Microscopic Examination   Collection Time: 12/31/23 10:35 AM   Urine  Result  Value Ref Range   WBC, UA >30 (A) 0 - 5 /hpf   RBC, Urine 11-30 (A) 0 - 2 /hpf   Epithelial Cells (non renal) 0-10 0 - 10 /hpf   Mucus, UA Present (A) Not Estab.   Bacteria, UA Many (A) None seen/Few  Urinalysis, Complete   Collection Time: 12/31/23 10:35 AM  Result Value Ref Range   Specific Gravity, UA 1.030 1.005 - 1.030   pH, UA 6.0 5.0 - 7.5   Color, UA Yellow Yellow   Appearance Ur Slightly cloudy Clear   Leukocytes,UA 2+ (A) Negative   Protein,UA Trace Negative/Trace   Glucose, UA Negative Negative   Ketones, UA Negative Negative   RBC, UA 3+ (A) Negative   Bilirubin, UA Negative Negative   Urobilinogen, Ur 0.2 0.2 - 1.0 mg/dL   Nitrite, UA Positive (A) Negative   Microscopic Examination See below:     Pertinent Imaging: KUB, 12/30/2023: See Epic  I personally reviewed the images referenced above and note interval resolution of the proximal left ureteral stone.  Assessment & Plan:   1. Left ureteral stone (Primary) Fragments passed, interval resolution of the stone on KUB.  His UA is rather suspicious today, given recency of stone episode we will start empiric Bactrim  and send for culture for further evaluation.  He prefers to follow-up as needed, which is reasonable. - Urinalysis, Complete - CULTURE, URINE COMPREHENSIVE - sulfamethoxazole -trimethoprim  (BACTRIM  DS) 800-160 MG tablet; Take 1 tablet by mouth 2 (two) times daily for 7 days.  Dispense: 14 tablet; Refill: 0   Return if symptoms worsen or fail to improve.  Lucie Hones, PA-C  [facility] [address], [address] [place], [place] [address] [phone]

## 2024-01-01 ENCOUNTER — Telehealth: Payer: Self-pay

## 2024-01-01 NOTE — Telephone Encounter (Signed)
 Pt LM on triage line  He states he had a f/u visit yesterday and was dx with a UTI. He wants to know what the culture results are.   Pt aware that cx results could take 3 days to results. He was given ATB at his appt. Bactrim . He states he is taking it. Pushing fluids. Feels tired and dizzy. Encouraged him to continue with fluids and ATB until cx is back. No fever. If sx get worse to contact office.   Pt voiced understanding.

## 2024-01-06 LAB — CULTURE, URINE COMPREHENSIVE

## 2024-01-07 ENCOUNTER — Telehealth: Payer: Self-pay

## 2024-01-07 ENCOUNTER — Ambulatory Visit: Payer: Self-pay | Admitting: Physician Assistant

## 2024-01-07 NOTE — Telephone Encounter (Signed)
 Please contact the patient and let him know that I sent him his urine culture results via MyChart.  He is on culture appropriate Bactrim  per his culture results.  That said, the radiologist noticed that there is a small stone on the right side that he is currently passing despite his recent left shockwave lithotripsy.  Please triage his symptoms.  If he is having any pain or signs of infection despite antibiotics, please offer him a same-day visit with me or [facility] ASAP.

## 2024-01-07 NOTE — Telephone Encounter (Signed)
 Pt LVM on the triage line stating he wanted to know his urine culture results. Attempted to call pt back informing him that results were just resulted back yesterday afternoon and Sam had a full clinic and will get back in touch with him as soon as she responds.

## 2024-01-16 DIAGNOSIS — Z125 Encounter for screening for malignant neoplasm of prostate: Secondary | ICD-10-CM | POA: Diagnosis not present

## 2024-01-16 DIAGNOSIS — E039 Hypothyroidism, unspecified: Secondary | ICD-10-CM | POA: Diagnosis not present

## 2024-01-16 DIAGNOSIS — R399 Unspecified symptoms and signs involving the genitourinary system: Secondary | ICD-10-CM | POA: Diagnosis not present

## 2024-01-16 DIAGNOSIS — E1369 Other specified diabetes mellitus with other specified complication: Secondary | ICD-10-CM | POA: Diagnosis not present

## 2024-01-16 NOTE — Progress Notes (Signed)
 This video encounter was conducted with the patient's (or proxy's) verbal consent via secure, interactive audio and video telecommunications while away from clinic/office/hospital.  The patient (or proxy) was instructed to have this encounter in a suitably private space and to only have persons present to whom they give permission to participate. In addition, patient identity was confirmed by use of name plus two identifiers.  This visit was coded based on medical decision making (MDM).   Chief Complaint:   Chief Complaint  Patient presents with  . Urinary Frequency    Patient having frequency, and had UA at lab this morning-  (past history of Stones and sees Urology); urine sent for culture (also had labs in system to be drawn but was not fasting) main concern today is UTI    Subjective:  Alex Newman is a 53 y.o. male in today for virtual visit.  He had lithotripsy over a month ago for recurrent kidney stones.  At follow up for that on 12/31/23, he was started on antibiotic (probably Septra  DS) for 1 week.  He states he didn't have any symptoms at the time of his visit but started having dysuira and decreased urinary volume in the past 3-7 days.  Stream and frequency are normal.  No abdominal or flank pain, no nausea, vomiting, fever or chills.  He has DM and notes BS's have stayed well controlled, around 100 in the AM and always <170-180 later in the day.  Past Medical History:  Diagnosis Date  . Allergies   . Arthritis   . Fracture   . Kidney stones   . Thyroid  disease    Past Surgical History:  Procedure Laterality Date  . kidney stone    . right wrist Right    cyst   Family History  Problem Relation Name Age of Onset  . No Known Problems Mother    . Myocardial Infarction (Heart attack) Father     Social History   Socioeconomic History  . Marital status: Married  . Number of children: 2  Tobacco Use  . Smoking status: Never  . Smokeless tobacco: Never  Vaping Use   . Vaping status: Never Used  Substance and Sexual Activity  . Alcohol use: Never  . Drug use: Never  . Sexual activity: Yes    Partners: Female  Social History Narrative   Exercises 3 times per week for 15 minutes.    Social Drivers of Health   Housing Stability: Unknown (07/15/2023)   Housing Stability Vital Sign   . Homeless in the Last Year: No   Outpatient Medications Prior to Visit  Medication Sig Dispense Refill  . levothyroxine  (SYNTHROID ) 75 MCG tablet TAKE 1 TABLET BY MOUTH EVERY DAY 90 tablet 1  . semaglutide (OZEMPIC) 1 mg/dose (4 mg/3 mL) pen injector INJECT 0.75 MLS (1 MG TOTAL) SUBCUTANEOUSLY ONCE A WEEK. 3 mL 2  . blood glucose diagnostic (CONTOUR NEXT TEST STRIPS) test strip USE TO CHECK BLOOD SUGAR TWO TIMES A DAY 200 strip 1  . lancets Use 1 each 3 (three) times daily Use as instructed. 100 each 12  . metFORMIN  (GLUCOPHAGE -XR) 500 MG XR tablet Take 1 tablet (500 mg total) by mouth daily with dinner (Patient not taking: Reported on 01/16/2024) 90 tablet 1  . semaglutide (OZEMPIC) 0.25 mg or 0.5 mg (2 mg/3 mL) pen injector Inject 0.75 mLs (0.5 mg total) subcutaneously once a week (Patient not taking: Reported on 01/16/2024) 3 mL 1  . sildenafiL (VIAGRA) 50 MG tablet  TAKE 1 TABLET (50 MG TOTAL) BY MOUTH ONCE DAILY AS NEEDED FOR ERECTILE DYSFUNCTION 6 tablet 0   No facility-administered medications prior to visit.   Allergies  Allergen Reactions  . Cetirizine Hcl Other (See Comments)    REACTION: Headache  . Dog Dander Swelling    Nose swells  . Penicillins Other (See Comments)  . Propoxyphene N-Acetaminophen  Other (See Comments)    REACTION: ha, nausea    Objective:   Vitals:   01/16/24 1025  Height: 179.1 cm (5' 10.51)   Body mass index is 45.56 kg/m.   GENERAL:  Patient is alert and oriented, in no acute distress.   U/A:  WBC's and Leukocyte esterase are positive.  Assessment/Plan:  UTI.  Start Macrobid  BID for 10 days.  Await C+S results.  If he  has worsened symptoms over the weekend, should go the [facility] or ER.  Diabetes mellitus with other specified complication, without long-term current use of insulin (CMS/HHS-HCC).  Stay on Ozempic weekly. Plan: Lipid Panel w/calc LDL, CBC w/auto Differential (5 Part), Comprehensive Metabolic Panel (CMP),  Hemoglobin A1c next week   Hypothyroidism, unspecified type.  Adjust Synthroid  dose after labs. Plan: Thyroid  Stimulating-Hormone (TSH)  Will determine further follow up and management in the next week or so.    Alm Na, MD, PhD

## 2024-01-17 ENCOUNTER — Ambulatory Visit: Payer: Self-pay | Admitting: Physician Assistant

## 2024-02-03 DIAGNOSIS — R399 Unspecified symptoms and signs involving the genitourinary system: Secondary | ICD-10-CM | POA: Diagnosis not present

## 2024-02-13 ENCOUNTER — Other Ambulatory Visit: Payer: Self-pay

## 2024-02-13 ENCOUNTER — Emergency Department: Admission: EM | Admit: 2024-02-13 | Discharge: 2024-02-13 | Disposition: A | Discharge status: Home / Self Care

## 2024-02-13 ENCOUNTER — Emergency Department

## 2024-02-13 ENCOUNTER — Encounter: Payer: Self-pay | Admitting: Intensive Care

## 2024-02-13 DIAGNOSIS — K8020 Calculus of gallbladder without cholecystitis without obstruction: Secondary | ICD-10-CM | POA: Diagnosis not present

## 2024-02-13 DIAGNOSIS — E119 Type 2 diabetes mellitus without complications: Secondary | ICD-10-CM | POA: Insufficient documentation

## 2024-02-13 DIAGNOSIS — E039 Hypothyroidism, unspecified: Secondary | ICD-10-CM | POA: Insufficient documentation

## 2024-02-13 DIAGNOSIS — N390 Urinary tract infection, site not specified: Secondary | ICD-10-CM | POA: Diagnosis not present

## 2024-02-13 DIAGNOSIS — R935 Abnormal findings on diagnostic imaging of other abdominal regions, including retroperitoneum: Secondary | ICD-10-CM | POA: Diagnosis not present

## 2024-02-13 DIAGNOSIS — R1031 Right lower quadrant pain: Secondary | ICD-10-CM | POA: Diagnosis not present

## 2024-02-13 DIAGNOSIS — R10A1 Flank pain, right side: Secondary | ICD-10-CM

## 2024-02-13 DIAGNOSIS — N200 Calculus of kidney: Secondary | ICD-10-CM | POA: Diagnosis not present

## 2024-02-13 DIAGNOSIS — K429 Umbilical hernia without obstruction or gangrene: Secondary | ICD-10-CM | POA: Diagnosis not present

## 2024-02-13 LAB — URINALYSIS, ROUTINE W REFLEX MICROSCOPIC
Bilirubin Urine: NEGATIVE
Glucose, UA: NEGATIVE mg/dL
Ketones, ur: NEGATIVE mg/dL
Nitrite: POSITIVE — AB
Protein, ur: 30 mg/dL — AB
Specific Gravity, Urine: 1.005 (ref 1.005–1.030)
Squamous Epithelial / HPF: 0 /HPF (ref 0–5)
WBC, UA: >50 WBC/hpf (ref 0–5)
pH: 6 (ref 5.0–8.0)

## 2024-02-13 LAB — COMPREHENSIVE METABOLIC PANEL WITH GFR
ALT: 31 U/L (ref 0–44)
AST: 30 U/L (ref 15–41)
Albumin: 4.2 g/dL (ref 3.5–5.0)
Alkaline Phosphatase: 122 U/L (ref 38–126)
Anion gap: 15 (ref 5–15)
BUN: 13 mg/dL (ref 6–20)
CO2: 23 mmol/L (ref 22–32)
Calcium: 9.8 mg/dL (ref 8.9–10.3)
Chloride: 104 mmol/L (ref 98–111)
Creatinine, Ser: 1.06 mg/dL (ref 0.61–1.24)
GFR, Estimated: >60 mL/min (ref >60)
Glucose, Bld: 145 mg/dL — ABNORMAL HIGH (ref 70–99)
Potassium: 3.9 mmol/L (ref 3.5–5.1)
Sodium: 142 mmol/L (ref 135–145)
Total Bilirubin: 0.7 mg/dL (ref 0.0–1.2)
Total Protein: 7.5 g/dL (ref 6.5–8.1)

## 2024-02-13 LAB — CBC WITH DIFFERENTIAL/PLATELET
Abs Immature Granulocytes: 0.03 K/uL (ref 0.00–0.07)
Basophils Absolute: 0 K/uL (ref 0.0–0.1)
Basophils Relative: 0 %
Eosinophils Absolute: 0.1 K/uL (ref 0.0–0.5)
Eosinophils Relative: 1 %
HCT: 47.5 % (ref 39.0–52.0)
Hemoglobin: 16.3 g/dL (ref 13.0–17.0)
Immature Granulocytes: 0 %
Lymphocytes Relative: 35 %
Lymphs Abs: 3 K/uL (ref 0.7–4.0)
MCH: 29.5 pg (ref 26.0–34.0)
MCHC: 34.3 g/dL (ref 30.0–36.0)
MCV: 86.1 fL (ref 80.0–100.0)
Monocytes Absolute: 0.6 K/uL (ref 0.1–1.0)
Monocytes Relative: 7 %
Neutro Abs: 4.8 K/uL (ref 1.7–7.7)
Neutrophils Relative %: 57 %
Platelets: 196 K/uL (ref 150–400)
RBC: 5.52 MIL/uL (ref 4.22–5.81)
RDW: 14 % (ref 11.5–15.5)
WBC: 8.5 K/uL (ref 4.0–10.5)
nRBC: 0 % (ref 0.0–0.2)

## 2024-02-13 MED ORDER — LEVOFLOXACIN IN D5W 750 MG/150ML IV SOLN
750.0000 mg | Freq: Once | INTRAVENOUS | Status: AC
  Administered 2024-02-13: 750 mg via INTRAVENOUS
  Filled 2024-02-13: qty 150

## 2024-02-13 MED ORDER — LEVOFLOXACIN 750 MG PO TABS: 750.0000 mg | ORAL_TABLET | Freq: Every day | ORAL | 0 refills | Status: AC

## 2024-02-13 MED ORDER — MORPHINE SULFATE (PF) 4 MG/ML IV SOLN
4.0000 mg | Freq: Once | INTRAVENOUS | Status: AC
  Administered 2024-02-13: 4 mg via INTRAVENOUS
  Filled 2024-02-13: qty 1

## 2024-02-13 MED ORDER — SODIUM CHLORIDE 0.9 % IV BOLUS
1000.0000 mL | Freq: Once | INTRAVENOUS | Status: AC
  Administered 2024-02-13: 1000 mL via INTRAVENOUS

## 2024-02-13 MED ORDER — IOHEXOL 300 MG/ML  SOLN
100.0000 mL | Freq: Once | INTRAMUSCULAR | Status: AC | PRN
  Administered 2024-02-13: 100 mL via INTRAVENOUS

## 2024-02-13 MED ORDER — TAMSULOSIN HCL 0.4 MG PO CAPS: 0.4000 mg | ORAL_CAPSULE | Freq: Every day | ORAL | 0 refills | Status: AC

## 2024-02-13 MED ORDER — ONDANSETRON HCL 4 MG/2ML IJ SOLN
4.0000 mg | Freq: Once | INTRAMUSCULAR | Status: AC
  Administered 2024-02-13: 4 mg via INTRAVENOUS
  Filled 2024-02-13: qty 2

## 2024-02-13 NOTE — ED Provider Notes (Signed)
 [facility] Provider Note    Event Date/Time   First MD Initiated Contact with Patient 02/13/24 1646     (approximate)   History   Flank Pain   HPI  Alex Newman is a 53 y.o. male history of kidney stones, hypothyroid, diabetes, hyperlipidemia presents emergency department complaining of severe right lower quadrant pain.  Patient states different than his kidney stones.  States they usually start up high and come down low.  States this 1 is located just in the right lower quadrant.  Having severe pain.  Pain started around 3:40 PM this afternoon.  He does have some burning with urination.  Recently had a bad UTI that took for antibiotics to clear up.      Physical Exam   Triage Vital Signs: ED Triage Vitals  Encounter Vitals Group     BP 02/13/24 1642 (!) 160/88     Girls Systolic BP Percentile --      Girls Diastolic BP Percentile --      Boys Systolic BP Percentile --      Boys Diastolic BP Percentile --      Pulse Rate 02/13/24 1639 88     Resp 02/13/24 1639 (!) 22     Temp 02/13/24 1639 97.7 F (36.5 C)     Temp Source 02/13/24 1639 Oral     SpO2 02/13/24 1639 99 %     Weight 02/13/24 1636 (!) 315 lb (142.9 kg)     Height 02/13/24 1636 5' 11 (1.803 m)     Head Circumference --      Peak Flow --      Pain Score 02/13/24 1636 10     Pain Loc --      Pain Education --      Exclude from Growth Chart --     Most recent vital signs: Vitals:   02/13/24 1639 02/13/24 1642  BP:  (!) 160/88  Pulse: 88   Resp: (!) 22   Temp: 97.7 F (36.5 C)   SpO2: 99%      General: Awake, no distress.   CV:  Good peripheral perfusion.  Resp:  Normal effort.  Abd:  No distention.  Tender in the right lower quadrant Other:      ED Results / Procedures / Treatments   Labs (all labs ordered are listed, but only abnormal results are displayed) Labs Reviewed  COMPREHENSIVE METABOLIC PANEL WITH GFR - Abnormal; Notable for the following  components:      Result Value   Glucose, Bld 145 (*)    All other components within normal limits  URINALYSIS, ROUTINE W REFLEX MICROSCOPIC - Abnormal; Notable for the following components:   Color, Urine YELLOW (*)    APPearance HAZY (*)    Hgb urine dipstick MODERATE (*)    Protein, ur 30 (*)    Nitrite POSITIVE (*)    Leukocytes,Ua LARGE (*)    Bacteria, UA RARE (*)    All other components within normal limits  URINE CULTURE  CBC WITH DIFFERENTIAL/PLATELET     EKG     RADIOLOGY CT abdomen pelvis IV contrast    PROCEDURES:   Procedures  Critical Care:  no Chief Complaint  Patient presents with   Flank Pain      MEDICATIONS ORDERED IN ED: Medications  levofloxacin (LEVAQUIN) IVPB 750 mg (has no administration in time range)  sodium chloride  0.9 % bolus 1,000 mL (1,000 mLs Intravenous New Bag/Given 02/13/24 1713)  morphine  (PF) 4 MG/ML injection 4 mg (4 mg Intravenous Given 02/13/24 1709)  ondansetron  (ZOFRAN ) injection 4 mg (4 mg Intravenous Given 02/13/24 1709)  iohexol (OMNIPAQUE) 300 MG/ML solution 100 mL (100 mLs Intravenous Contrast Given 02/13/24 1759)     IMPRESSION / MDM / ASSESSMENT AND PLAN / ED COURSE  I reviewed the triage vital signs and the nursing notes.                              Differential diagnosis includes, but is not limited to, kidney stone, acute appendicitis, bowel obstruction, dissection  Patient's presentation is most consistent with acute illness / injury with system symptoms.   Medications given: Morphine  4 mg IV for pain, Zofran  4 mg IV, normal saline 1 L IV  Labs and imaging ordered.  Pain medication ordered.  I did contact patient's wife to let her know that he is here at his request.   Labs reassuring except for the UA, UA showing large amount of leuks, white blood cell clumps, and hemoglobin.  CT is reassuring so do not feel it is, infected kidney stone at this time.  CT was independent review and interpretation by  me as being negative for any acute abnormality.  Therefore do not see any red flags to warrant admission.  However we will do IV Levaquin 750 mg while here in the ED.  He had been given morphine  and Zofran  along with saline for pain.  Patient states pain is feeling much better now.  He would prefer to go home instead of consider admission.  He was given a prescription for Levaquin.  Flomax .  He is to follow-up with his doctor.  Urine culture ordered.  Did let him know that these results would not be in until 2 to 3 days.  Someone will call him if we need to change the antibiotic and he can also see the results on [facility] MyChart.  Patient states he understands.  Be discharged after medications and is in stable condition.   FINAL CLINICAL IMPRESSION(S) / ED DIAGNOSES   Final diagnoses:  Acute right flank pain  Recurrent UTI     Rx / DC Orders   ED Discharge Orders          Ordered    levofloxacin (LEVAQUIN) 750 MG tablet  Daily        02/13/24 1854    tamsulosin  (FLOMAX ) 0.4 MG CAPS capsule  Daily        02/13/24 1854             Note:  This document was prepared using Dragon voice recognition software and may include unintentional dictation errors.    Gasper Devere ORN, PA-C 02/13/24 1903    Clarine Ozell LABOR, MD 02/14/24 [phone]

## 2024-02-13 NOTE — ED Triage Notes (Signed)
 Patient c/o right flank pain that started around 3:45pm. Reports burning with urination  History kidney stones

## 2024-02-13 NOTE — Discharge Instructions (Signed)
 Follow-up with your regular doctor.  Call for an appointment.  Your urine culture will result in about 2 days.  He can see this result on [facility] MyChart.  Return the emergency department if you are worsening

## 2024-02-13 NOTE — ED Notes (Signed)
 See triage note  Presents right sided flank pain  States pain started suddenly  hx of renal stones Pt is unable to  be still

## 2024-02-16 LAB — URINE CULTURE: Culture: >=100000 — AB
